# Patient Record
Sex: Female | Born: 1955 | Race: White | Hispanic: No | Marital: Single | Smoking: Never smoker
Health system: Southern US, Community
[De-identification: ages and names within clinical notes are randomized; demographics above are authoritative.]

## PROBLEM LIST (undated history)

## (undated) DIAGNOSIS — I10 Essential (primary) hypertension: Secondary | ICD-10-CM

## (undated) DIAGNOSIS — J45909 Unspecified asthma, uncomplicated: Secondary | ICD-10-CM

## (undated) DIAGNOSIS — T7840XA Allergy, unspecified, initial encounter: Secondary | ICD-10-CM

## (undated) DIAGNOSIS — I639 Cerebral infarction, unspecified: Secondary | ICD-10-CM

## (undated) DIAGNOSIS — F419 Anxiety disorder, unspecified: Secondary | ICD-10-CM

## (undated) DIAGNOSIS — N189 Chronic kidney disease, unspecified: Secondary | ICD-10-CM

## (undated) HISTORY — DX: Unspecified asthma, uncomplicated: J45.909

## (undated) HISTORY — DX: Chronic kidney disease, unspecified: N18.9

## (undated) HISTORY — DX: Cerebral infarction, unspecified: I63.9

## (undated) HISTORY — DX: Essential (primary) hypertension: I10

## (undated) HISTORY — DX: Anxiety disorder, unspecified: F41.9

## (undated) HISTORY — DX: Allergy, unspecified, initial encounter: T78.40XA

---

## 2001-10-13 ENCOUNTER — Other Ambulatory Visit: Admission: RE | Admit: 2001-10-13 | Discharge: 2001-10-13 | Payer: Self-pay | Admitting: Obstetrics and Gynecology

## 2004-12-01 ENCOUNTER — Ambulatory Visit: Payer: Self-pay | Admitting: Endocrinology

## 2004-12-08 ENCOUNTER — Ambulatory Visit: Payer: Self-pay | Admitting: Endocrinology

## 2005-03-27 ENCOUNTER — Inpatient Hospital Stay: Payer: Self-pay | Admitting: Endocrinology

## 2005-04-26 ENCOUNTER — Ambulatory Visit: Payer: Self-pay | Admitting: General Surgery

## 2006-11-18 ENCOUNTER — Emergency Department: Payer: Self-pay | Admitting: Emergency Medicine

## 2006-12-28 IMAGING — CT CT OF THE RIGHT TIBIA FIBULA WITHOUT CONTRAST
1 of 2 series · 9 of 14 positions shown, 12 images · non-contrast
Comparison: none

REASON FOR EXAM: Gangrene on plain films; assess proximal extent
COMMENTS:

[Series 4: inspace · axial · 0.47mm/px · z∈[-1184,-627]mm · 9 of 996 slices shown, 12 images]
[im 100/996  soft-tissue]
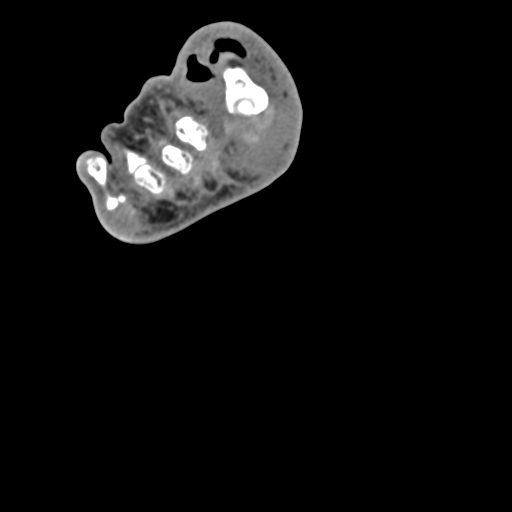
[im 100/996  bone]
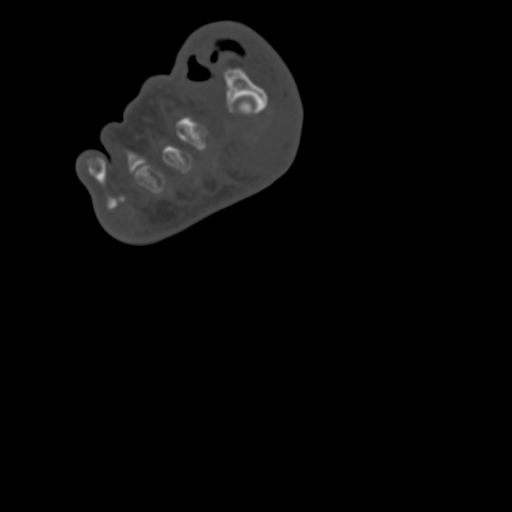
[im 200/996  bone]
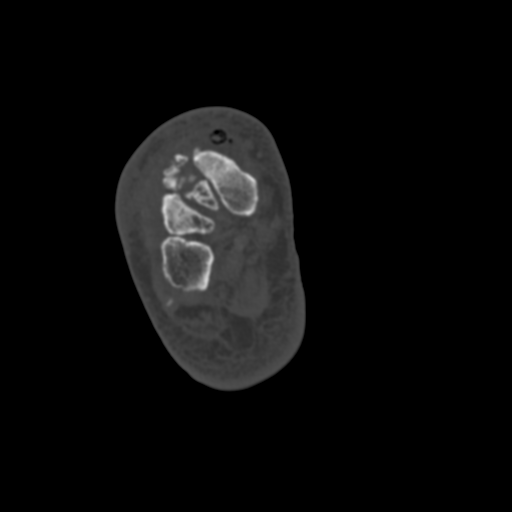
[im 299/996  bone]
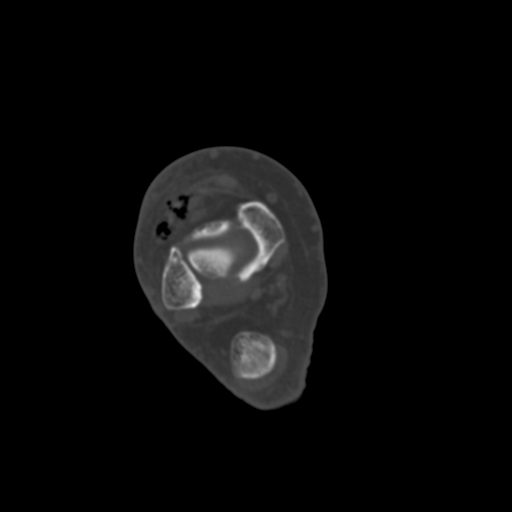
[im 399/996  bone]
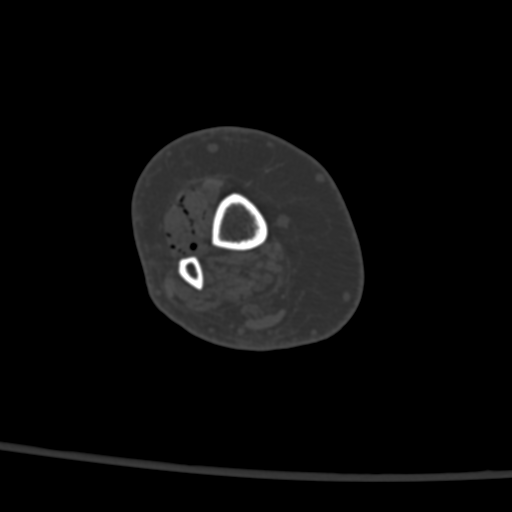
[im 498/996  soft-tissue]
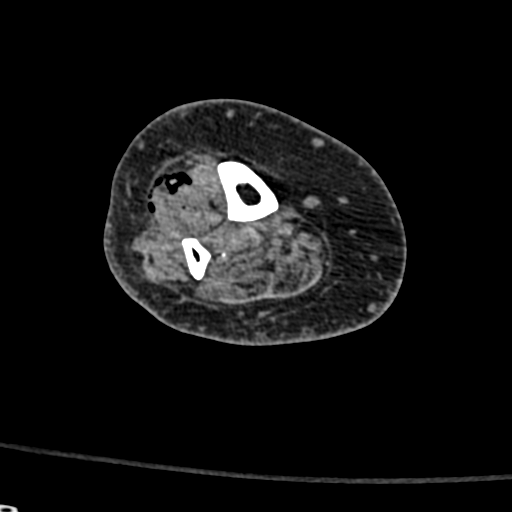
[im 498/996  bone]
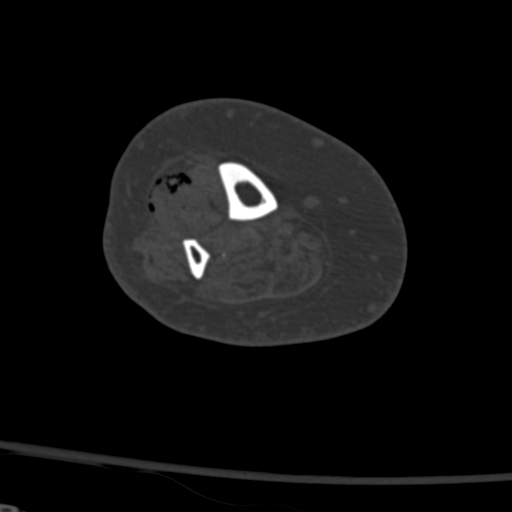
[im 598/996  bone]
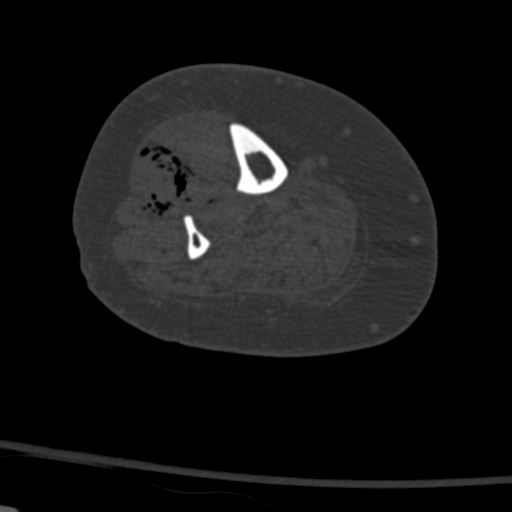
[im 697/996  bone]
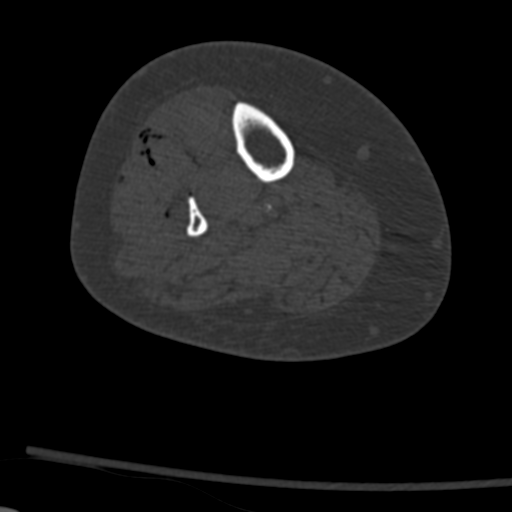
[im 797/996  bone]
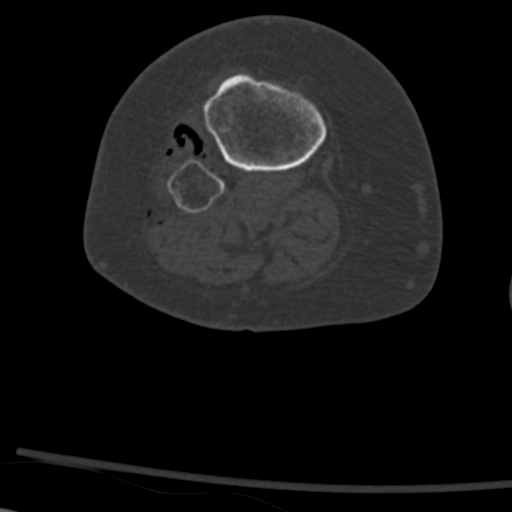
[im 896/996  soft-tissue]
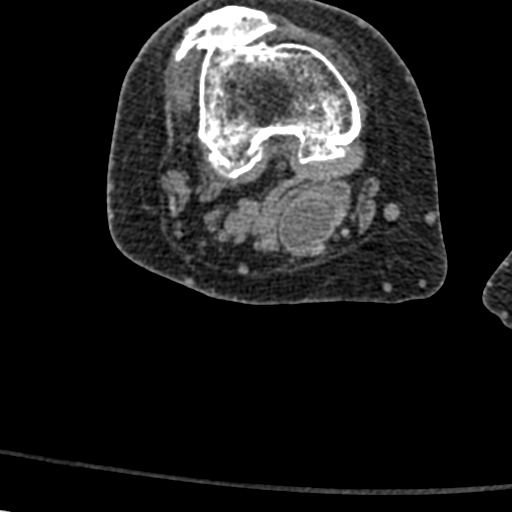
[im 896/996  bone]
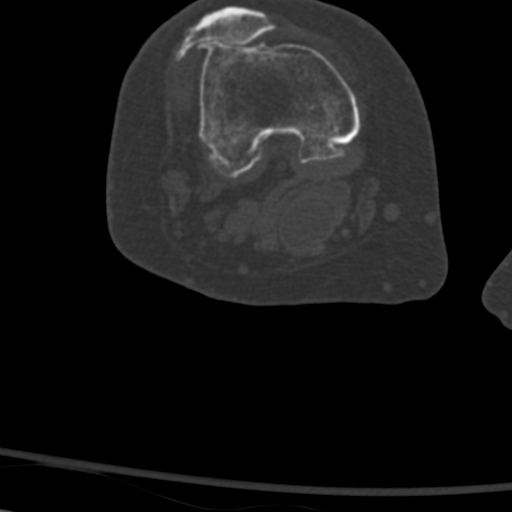

[9 of 14 positions shown; findings below may reference images not displayed]

PROCEDURE:     CT  - CT TIBIA / LOWER RIGHT WO  - March 27, 2005  [DATE]

RESULT:          There is rather extensive soft tissue gas extending from
the great toe in areas of the dorsum of the foot and over the lower
extremity to above the knee.  This is primarily following the tract of the
extensor hallucis longus muscle and tendon.  There may be some involvement
of the extensor digitorum longus muscle near the knee.  There is a small
amount of gas seen posteriorly above the level of the popliteal fossa medial
to the biceps femoris muscle and adjacent to the tibial nerve.  Air is also
seen about the dorsal aspect of the great toe distal to the sesamoids.
There is evidence of cellulitis over the area of the foot.
IMPRESSION: Please see above.

## 2009-04-09 ENCOUNTER — Ambulatory Visit: Payer: Self-pay | Admitting: Internal Medicine

## 2009-04-20 ENCOUNTER — Inpatient Hospital Stay: Payer: Self-pay | Admitting: Internal Medicine

## 2009-04-28 ENCOUNTER — Ambulatory Visit: Payer: Self-pay | Admitting: Internal Medicine

## 2009-05-10 ENCOUNTER — Ambulatory Visit: Payer: Self-pay | Admitting: Internal Medicine

## 2009-06-10 ENCOUNTER — Ambulatory Visit: Payer: Self-pay | Admitting: Internal Medicine

## 2009-07-08 ENCOUNTER — Ambulatory Visit: Payer: Self-pay | Admitting: Internal Medicine

## 2009-08-08 ENCOUNTER — Ambulatory Visit: Payer: Self-pay | Admitting: Internal Medicine

## 2009-09-07 ENCOUNTER — Ambulatory Visit: Payer: Self-pay | Admitting: Internal Medicine

## 2009-10-08 ENCOUNTER — Ambulatory Visit: Payer: Self-pay | Admitting: Internal Medicine

## 2009-10-09 ENCOUNTER — Inpatient Hospital Stay: Payer: Self-pay | Admitting: Internal Medicine

## 2009-11-07 ENCOUNTER — Ambulatory Visit: Payer: Self-pay | Admitting: Internal Medicine

## 2009-11-11 ENCOUNTER — Ambulatory Visit: Payer: Self-pay | Admitting: Internal Medicine

## 2009-12-03 ENCOUNTER — Inpatient Hospital Stay: Payer: Self-pay | Admitting: Internal Medicine

## 2009-12-08 ENCOUNTER — Ambulatory Visit: Payer: Self-pay | Admitting: Internal Medicine

## 2009-12-30 ENCOUNTER — Ambulatory Visit: Payer: Self-pay | Admitting: Vascular Surgery

## 2010-01-08 ENCOUNTER — Ambulatory Visit: Payer: Self-pay | Admitting: Vascular Surgery

## 2010-01-27 ENCOUNTER — Ambulatory Visit: Payer: Self-pay | Admitting: Internal Medicine

## 2010-02-07 ENCOUNTER — Ambulatory Visit: Payer: Self-pay | Admitting: Internal Medicine

## 2010-03-09 ENCOUNTER — Ambulatory Visit: Payer: Self-pay | Admitting: Internal Medicine

## 2010-04-20 ENCOUNTER — Inpatient Hospital Stay: Payer: Self-pay | Admitting: Internal Medicine

## 2010-05-10 DIAGNOSIS — I639 Cerebral infarction, unspecified: Secondary | ICD-10-CM

## 2010-05-10 HISTORY — DX: Cerebral infarction, unspecified: I63.9

## 2010-05-12 ENCOUNTER — Inpatient Hospital Stay: Payer: Self-pay | Admitting: Internal Medicine

## 2010-06-11 ENCOUNTER — Ambulatory Visit: Payer: Self-pay | Admitting: Vascular Surgery

## 2010-06-12 ENCOUNTER — Ambulatory Visit: Payer: Self-pay | Admitting: Vascular Surgery

## 2010-06-17 ENCOUNTER — Ambulatory Visit: Payer: Self-pay | Admitting: Vascular Surgery

## 2010-06-25 ENCOUNTER — Inpatient Hospital Stay: Payer: Self-pay | Admitting: Internal Medicine

## 2010-07-09 ENCOUNTER — Inpatient Hospital Stay: Payer: Self-pay | Admitting: Internal Medicine

## 2010-07-27 ENCOUNTER — Inpatient Hospital Stay: Payer: Self-pay | Admitting: Internal Medicine

## 2010-08-12 ENCOUNTER — Ambulatory Visit: Payer: Self-pay | Admitting: Vascular Surgery

## 2010-09-23 ENCOUNTER — Ambulatory Visit: Payer: Self-pay | Admitting: Vascular Surgery

## 2010-11-06 ENCOUNTER — Ambulatory Visit: Payer: Self-pay | Admitting: Vascular Surgery

## 2010-12-20 ENCOUNTER — Inpatient Hospital Stay: Payer: Self-pay | Admitting: Internal Medicine

## 2011-01-01 ENCOUNTER — Ambulatory Visit: Payer: Self-pay | Admitting: Physician Assistant

## 2011-01-25 ENCOUNTER — Observation Stay: Payer: Self-pay | Admitting: Internal Medicine

## 2011-03-24 ENCOUNTER — Ambulatory Visit: Payer: Self-pay | Admitting: Cardiovascular Disease

## 2012-09-21 IMAGING — CR DG HUMERUS 2V *L*
1 series · 2 of 2 positions shown · non-contrast
Comparison: none

REASON FOR EXAM: pain following trauma
COMMENTS:

PROCEDURE:     DXR - DXR HUMERUS LEFT  - December 20, 2010 [DATE]
RESULT:
A humeral neck fracture is appreciated. Distal fracture fragment
demonstrates lateral angulation and medial displacement.

[Series 1: view not recorded · 0.17mm/px · 2 of 2 slices shown]
[im 1/2]
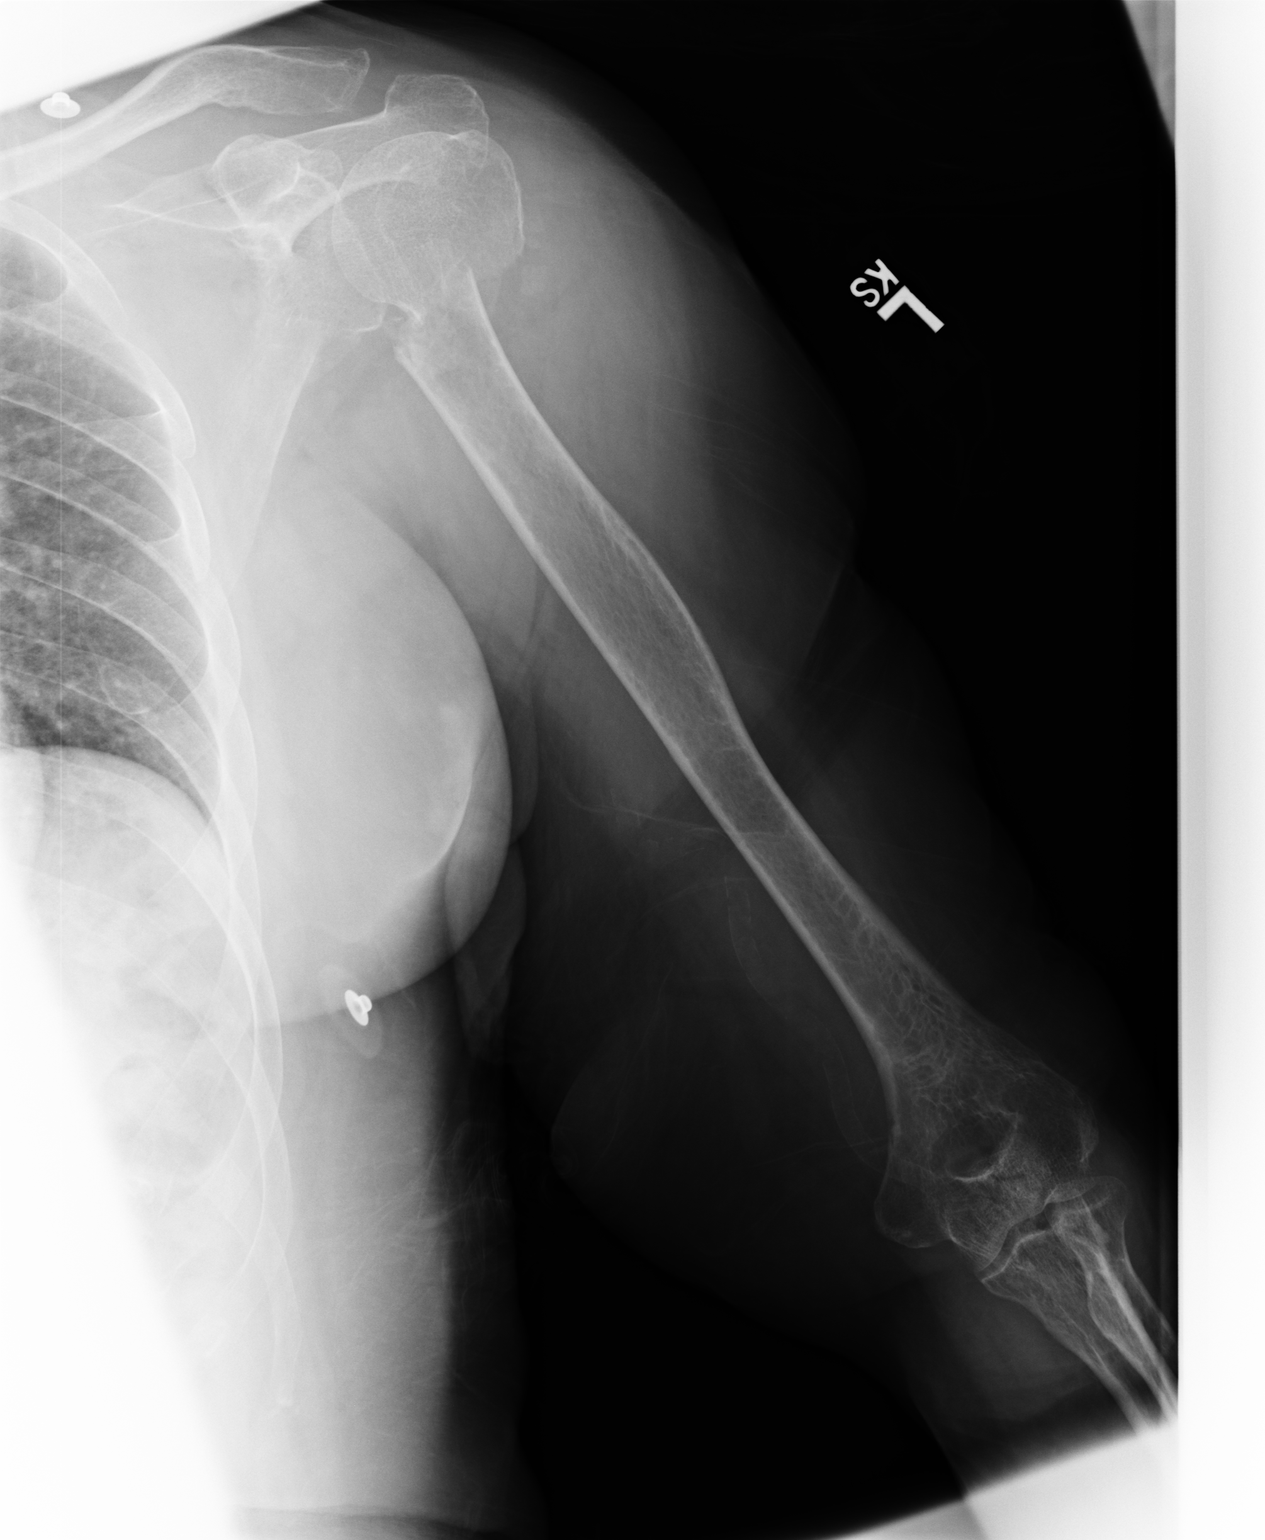
[im 2/2]
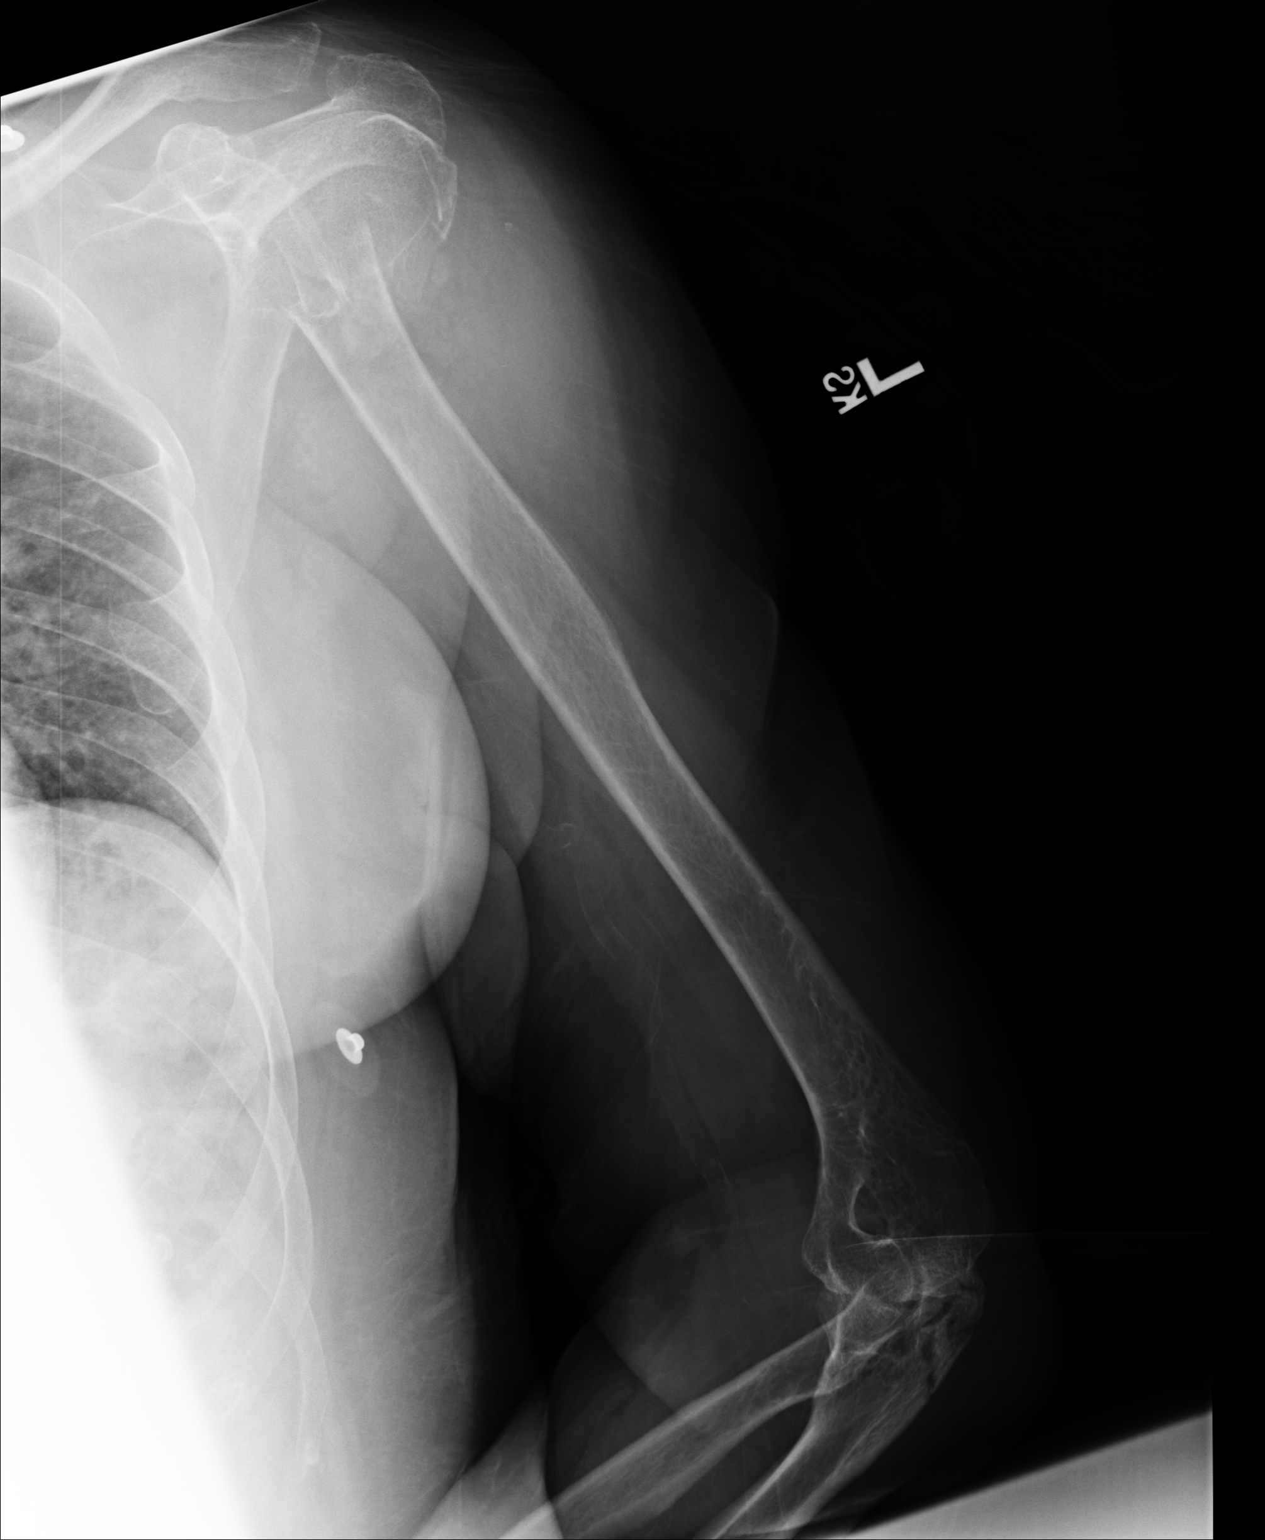

[2 of 2 positions shown; findings below may reference images not displayed]

IMPRESSION: Humeral neck fracture as described above.

## 2013-09-09 ENCOUNTER — Emergency Department: Payer: Self-pay | Admitting: Emergency Medicine

## 2014-11-26 ENCOUNTER — Encounter: Payer: Self-pay | Admitting: Physical Therapy

## 2014-11-26 ENCOUNTER — Ambulatory Visit: Payer: Medicare Other | Attending: Internal Medicine | Admitting: Physical Therapy

## 2014-11-26 DIAGNOSIS — R262 Difficulty in walking, not elsewhere classified: Secondary | ICD-10-CM | POA: Diagnosis not present

## 2014-11-26 DIAGNOSIS — R531 Weakness: Secondary | ICD-10-CM | POA: Insufficient documentation

## 2014-11-26 DIAGNOSIS — R269 Unspecified abnormalities of gait and mobility: Secondary | ICD-10-CM | POA: Insufficient documentation

## 2014-11-26 NOTE — Patient Instructions (Signed)
Seated Alternating Leg Raise (Marching)   Sit on ball. Raise bent knee and return. Repeat with other leg. Do _10__ sets of _2__ repetitions.  Copyright  VHI. All rights reserved.  KNEE: Extension, Long Arc Quads - Sitting   Raise leg until knee is straight. __10 _ reps per set, __2_ sets per day, __5 days per week  Copyright  VHI. All rights reserved.   SIT TO STAND: No Device   Sit with feet shoulder-width apart, on floor.(Make sure that you are in a chair that won't move like a chair against a wall or couch etc) Lean chest forward, raise hips up from surface. Straighten hips and knees. Weight bear equally on left and right sides. 10___ reps per set, _2__ sets per day, _5__ days per week Place left leg closer to sitting surface.

## 2014-11-26 NOTE — Therapy (Addendum)
[facility] [address] [place], [place], [address] Phone: [phone]   Fax:  [phone]  Physical Therapy Evaluation  Patient Details  Name: Carrie DallasWanda L Henry MRN: [id number] Date of Birth: 08/19/1955 Referring Provider:  Leotis ShamesSingh, Jasmine, MD  Encounter Date: 11/26/2014      PT End of Session - 11/26/14 1546    Visit Number 1   Number of Visits 21   Date for PT Re-Evaluation 02/04/15   Authorization Type gcode 1    Authorization Time Period 10    PT Start Time 1301   PT Stop Time 1403   PT Time Calculation (min) 62 min   Equipment Utilized During Treatment Gait belt   Activity Tolerance Patient tolerated treatment well   Behavior During Therapy [facility] for tasks assessed/performed      Past Medical History  Diagnosis Date  . Stroke 2012   . Hypertension   . Anxiety   . Chronic kidney disease   . Allergy   . Asthma     History reviewed. No pertinent past surgical history.  There were no vitals filed for this visit.  Visit Diagnosis:  Abnormality of gait - Plan: PT plan of care cert/re-cert  Weakness - Plan: PT plan of care cert/re-cert  Difficulty walking - Plan: PT plan of care cert/re-cert      Subjective Assessment - 11/26/14 1312    Subjective Patient is a pleasant 59 year old female. Reports to PT with a referral for deconditioning. She states that she has weakness in upper and lower extremities (LE feels more impaired) s/p CVA in 2012, weakness has been problematic for her since the completion of PT in 2012. Patient also on hemodialysis due to end stage renal failure with port in distal L UE. Patient currently receives dialysis for 3 days a week, Monday Wednesday and Friday. She states that she has recently been placed on a kidney transplant list, and could be notified at any point that one has become available.     Pertinent History CVA in 2012. Periteneal dialyiss in 2011.  Hemodalysis started in 2012,  Patient states that she also had a an infection in R LE, almost requiring amputation.  Also has history of A-fib, CAD, and seizure disorder.    Limitations Standing;Walking;House hold activities   How long can you stand comfortably? Patient reports that she feels nervous with standing without AD, but can stand about as long as she needs with AD    How long can you walk comfortably? for about one hours, provided that she has AD.    Patient Stated Goals sit<>stand with out assistance, decrease dependance on ADs   Currently in Pain? No/denies            [facility] PT Assessment - 11/27/14 0001    Assessment   Medical Diagnosis Deconditioning.    Onset Date/Surgical Date 11/26/14   Hand Dominance Right   Next MD Visit Will see Dr Thedore MinsSingh at least once a week at dialysis.    Prior Therapy PT for CVA in 2012, Reports that PT lasted for 3 months at Skilled nursing facility and some home health for weakness. No recent PT for this condition.    Precautions   Precautions Fall   Restrictions   Weight Bearing Restrictions No   Home Environment   Living Environment Private residence   Living Arrangements Alone   Available Help at Discharge Family;Neighbor   Type of Home Apartment   Home  Access Ramped entrance   Home Layout One level   Home Equipment Cane - quad;Walker - 4 wheels;Wheelchair - manual;Grab bars - tub/shower;Shower seat   Prior Function   Level of Independence Independent with basic ADLs   Vocation Retired;On disability   Leisure Information systems manager arrangements.    Cognition   Overall Cognitive Status Within Functional Limits for tasks assessed   Observation/Other Assessments   Observations Scar on lateral  R LE, discoloration on Bilateral LE distal to knee. Fistula in distal L UE.    Activities of Balance Confidence Scale (ABC Scale)  53% (increased risk for falls)   Sensation   Additional Comments no numbness/tingling. Edema noted in Bilateral LE    Coordination   Gross Motor  Movements are Fluid and Coordinated Yes   Fine Motor Movements are Fluid and Coordinated Yes   Strength   Overall Strength Comments L UE 4/5 in shoulder, 4+/5 in elbow . R UE 4-/5 in shoulder, 4/4 in elbow . Hip flexion 4-/5 bilaterally L knee extension/flexion 4+/5. R knee flexion/extension 4/5 . hip abduction/adduction 5/5    Transfers   Comments Patient required definite use of hands for transfer from [facility] to chair, as well as RW for stability.    Ambulation/Gait   Gait Comments Decreased step length and foot clearance bilaterally. increased shoulder shrug on R shoulder in stance and with gait. Patient also ambulates with flexed trunk posture.    6 Minute walk- Post Test   6 Minute Walk Post Test yes   BP (mmHg) 133/85 mmHg   HR (bpm) 54   02 Sat (%RA) 99 %   Modified Borg Scale for Dyspnea 2- Mild shortness of breath   6 minute walk test results    Aerobic Endurance Distance Walked 400   Endurance additional comments <1000 ft indicates decreased community ambulation.    Standardized Balance Assessment   Five times sit to stand comments  39 seconds (>15 seconds indicates increased fall risk)   10 Meter Walk 0.4 m/s ( <1.0 indicates increased fall risk)    Timed Up and Go Test   TUG Normal TUG   Normal TUG (seconds) 37.4   TUG Comments (>12 seconds indicates fall risk .          Treatment:   Educated patient on HEP.  Sit to stand x10  Seated marches x10 each LE  Seated knee extension x10 each LE.   Moderate Verbal cues for increased ROM, decreased speed of movement, and proper use of UE with sit to stand.                   PT Education - 11/26/14 1705    Education provided Yes   Education Details plan of care. HEP - see patient instructions.    Person(s) Educated Patient   Methods Explanation;Demonstration;Verbal cues   Comprehension Verbalized understanding;Returned demonstration;Verbal cues required             PT Long Term Goals - 11/26/14 1701     PT LONG TERM GOAL #1   Title Patient will be independent with HEP in order to increase strength/balance and allow greater independence with iADLs by 02/04/2015   Time 10   Period Weeks   Status New   PT LONG TERM GOAL #2   Title Patient will increase 6 minute walk test to >1000 ft in order to increase access to community with greater independence by 02/04/2015   Time 10   Period Weeks  Status New   PT LONG TERM GOAL #3   Title Patient will decrease 5xSTS to <15 seconds in order to increase LE power and reduce fall risk by 02/04/2015   Time 10   Period Weeks   Status New   PT LONG TERM GOAL #4   Title Patient will increase LE strength to at least 4+/5 throughout in order to allow increased independence with household chores by 02/04/2015   Time 10   Period Weeks   Status New   PT LONG TERM GOAL #5   Title Patient will improve Gait speed to >0.32m/s to be considered a community ambulator and decrease risk for falls by 02/04/2015   Time 10   Period Weeks   Status New               Plan - 11/26/14 1549    Clinical Impression Statement Patient is a pleasant 59 year old female that reports to PT with deconditioning. Patient presents with decreased strength in Bilateral UE and LE, R LE and R UE are more impaired than L. ROM is grossly within functional limits in bilateral UE and LE. Patient demonstrates decreased LE power, balance, and endurance as evidenced by standardized outcome measures including the 5xSTS (39 seconds; >15 indicates increased fall risk), (0.4 m/s, Indicates patient is at the household ambulation level and increased fall risk), TUG(37 seconds; >12 seconds indicates increased fall risk), and  6 minute walk test (400 ft; <1000 ft indicates decreased community ambulation.) Patient also scored a 51% on the ABC-s(indicating increased risk for falls). Due to deficits found through PT eval, this patient would benefit from PT in order to increase strength, improve gait,  and enhance balance to increase ease of access to the community and increased independence with iADLs.   Pt will benefit from skilled therapeutic intervention in order to improve on the following deficits Abnormal gait;Cardiopulmonary status limiting activity;Decreased activity tolerance;Decreased balance;Decreased endurance;Decreased mobility;Decreased safety awareness;Decreased strength;Difficulty walking;Increased edema;Impaired perceived functional ability;Improper body mechanics;Postural dysfunction   Rehab Potential Fair   Clinical Impairments Affecting Rehab Potential Negative: Low level baseline, Comorbitiies including CAD, A-fib, ESRD, history of CVA, low family support. Positive: hopeful to improve.    PT Frequency 2x / week   PT Duration Other (comment)  10 weeks.    PT Treatment/Interventions ADLs/Self Care Home Management;Cryotherapy;Moist Heat;Gait training;Stair training;Functional mobility training;Therapeutic activities;Therapeutic exercise;Balance training;Neuromuscular re-education;Patient/family education;Wheelchair mobility training;Energy conservation   PT Next Visit Plan Strengthening exercises. Berg balance scale.    PT Home Exercise Plan see patient instructions.    Consulted and Agree with Plan of Care Patient          G-Codes - Dec 04, 2014 0942    Functional Assessment Tool Used 6 min walk, 10 meter, TUG, 5 times sit<>stand   Functional Limitation Mobility: Walking and moving around   Mobility: Walking and Moving Around Current Status [phone]) At least 60 percent but less than 80 percent impaired, limited or restricted   Mobility: Walking and Moving Around Goal Status [phone]) At least 20 percent but less than 40 percent impaired, limited or restricted       Problem List There are no active problems to display for this patient. Thank you for this referral. Grier Rocher, SPT This entire session was performed under direct supervision and direction of a licensed  therapist . I have personally read, edited and approve of the note as written.   Hopkins,Margaret, PT, DPT 04-Dec-2014, 9:48 AM  [facility]  MAIN [facility] SERVICES [address] [place], [place], [address] Phone: [phone]   Fax:  [phone]

## 2014-12-03 ENCOUNTER — Ambulatory Visit: Payer: Medicare Other | Admitting: Physical Therapy

## 2014-12-05 ENCOUNTER — Ambulatory Visit: Payer: Medicare Other | Admitting: Physical Therapy

## 2014-12-06 ENCOUNTER — Encounter: Payer: Medicare Other | Admitting: Physical Therapy

## 2014-12-09 ENCOUNTER — Encounter: Payer: Medicare Other | Admitting: Physical Therapy

## 2014-12-10 ENCOUNTER — Ambulatory Visit: Payer: Medicare Other | Attending: Internal Medicine | Admitting: Physical Therapy

## 2014-12-10 ENCOUNTER — Encounter: Payer: Self-pay | Admitting: Physical Therapy

## 2014-12-10 DIAGNOSIS — R262 Difficulty in walking, not elsewhere classified: Secondary | ICD-10-CM | POA: Insufficient documentation

## 2014-12-10 DIAGNOSIS — R269 Unspecified abnormalities of gait and mobility: Secondary | ICD-10-CM | POA: Diagnosis not present

## 2014-12-10 DIAGNOSIS — R531 Weakness: Secondary | ICD-10-CM | POA: Insufficient documentation

## 2014-12-10 NOTE — Therapy (Signed)
[facility] MAIN [facility] SERVICES [address] [place], [place], [address] Phone: [phone]   Fax:  [phone]  Physical Therapy Treatment  Patient Details  Name: Carrie Henry MRN: [id number] Date of Birth: 13-Oct-1955 Referring Provider:  Hilda Blades, MD  Encounter Date: 12/10/2014      PT End of Session - 12/10/14 1311    Visit Number 2   Number of Visits 21   Date for PT Re-Evaluation 02/04/15   Authorization Type gcode 2   Authorization Time Period 10    PT Start Time 1300   PT Stop Time 1345   PT Time Calculation (min) 45 min   Equipment Utilized During Treatment Gait belt   Activity Tolerance Patient tolerated treatment well   Behavior During Therapy [facility] for tasks assessed/performed      Past Medical History  Diagnosis Date  . Stroke 2012   . Hypertension   . Anxiety   . Chronic kidney disease   . Allergy   . Asthma     History reviewed. No pertinent past surgical history.  There were no vitals filed for this visit.  Visit Diagnosis:  Abnormality of gait  Weakness  Difficulty walking      Subjective Assessment - 12/10/14 1309    Subjective Patient reports that she is doing well on this day. has not been seen at PT since 7/19 due to transportation difficulties. States that she has been doing her home exercises at least 2-3 times a day.    Pertinent History CVA in 2012. Periteneal dialyiss in 2011.  Hemodalysis started in 2012, Patient states that she also had a an infection in R LE, almost requiring amputation.  Also has history of A-fib, CAD, and seizure disorder.    Limitations Standing;Walking;House hold activities   How long can you stand comfortably? Patient reports that she feels nervous with standing without AD, but can stand about as long as she needs with AD    How long can you walk comfortably? for about one hours, provided that she has AD.    Patient Stated Goals sit<>stand with out assistance, decrease  dependance on ADs   Currently in Pain? No/denies            [facility] PT Assessment - 12/10/14 1329    Standardized Balance Assessment   Standardized Balance Assessment Berg Balance Test   Berg Balance Test   Sit to Stand Able to stand  independently using hands   Standing Unsupported Able to stand safely 2 minutes   Sitting with Back Unsupported but Feet Supported on Floor or Stool Able to sit safely and securely 2 minutes   Stand to Sit Uses backs of legs against chair to control descent   Transfers Able to transfer safely, definite need of hands   Standing Unsupported with Eyes Closed Able to stand 10 seconds safely   Standing Ubsupported with Feet Together Needs help to attain position but able to stand for 30 seconds with feet together   From Standing, Reach Forward with Outstretched Arm Can reach forward >5 cm safely (2")   From Standing Position, Pick up Object from Floor Able to pick up shoe, needs supervision   From Standing Position, Turn to Look Behind Over each Shoulder Turn sideways only but maintains balance   Turn 360 Degrees Able to turn 360 degrees safely but slowly   Standing Unsupported, Alternately Place Feet on Step/Stool Needs assistance to keep from falling or unable to try  Standing Unsupported, One Foot in Front Needs help to step but can hold 15 seconds   Standing on One Leg Able to lift leg independently and hold equal to or more than 3 seconds   Total Score 33        Treatment:  Nustep: level 2,  (unbilled)    Seated therapeutic exercise  Hip flexion 2x10 AROM bilaterally  Knee extension 2x10 AROM bilaterally  Sit to stand x8 with bilateral UE use ( significant fatigue noted)  Ankle PF 2x15 with red tband , bilaterally  Hip abduction 2x10 with red tband , bilaterally  Verbal and tactile instruction needed to improve exercise form increased hold time at end range for all exercises, increased control with eccentric movement, and increase  speed of movement with concentric motions.   HEP advancement/education Standing hip abduction x 10 , bilaterally  Standing hip flexion x10, bilaterally  Standing hip extension x10 , bilaterally   CGA, Verbal and tactile instruction for increased ROM, decreased speed of movement, and decreased compensation from trunk with LE movements.   patient performed Berg Balance Scale: 33/56  Significant difficulty with dynamic balance tasks and change of position.               PT Education - 12/10/14 1832    Education provided Yes   Education Details HEP advancement see patient instructions    Person(s) Educated Patient   Methods Explanation;Demonstration;Tactile cues;Verbal cues   Comprehension Verbalized understanding;Returned demonstration;Verbal cues required             PT Long Term Goals - 11/26/14 1701    PT LONG TERM GOAL #1   Title Patient will be independent with HEP in order to increase strength/balance and allow greater independence with iADLs by 02/04/2015   Time 10   Period Weeks   Status New   PT LONG TERM GOAL #2   Title Patient will increase 6 minute walk test to >1000 ft in order to increase access to community with greater independence by 02/04/2015   Time 10   Period Weeks   Status New   PT LONG TERM GOAL #3   Title Patient will decrease 5xSTS to <15 seconds in order to increase LE power and reduce fall risk by 02/04/2015   Time 10   Period Weeks   Status New   PT LONG TERM GOAL #4   Title Patient will increase LE strength to at least 4+/5 throughout in order to allow increased independence with household chores by 02/04/2015   Time 10   Period Weeks   Status New   PT LONG TERM GOAL #5   Title Patient will improve Gait speed to >0.63m/s to be considered a community ambulator and decrease risk for falls by 02/04/2015   Time 10   Period Weeks   Status New               Plan - 12/10/14 1833    Clinical Impression Statement Patient instructed in  LE strengthening exercises on this day, CGA for standing therex as well as verbal and tactile instruction with all therex for proper exercise form including increased hold time at end range for all exercises, increased control with eccentric movement, and increase speed of movement with concentric motions. Patient demonstrates significant fatigue with sit to stand exercises on this day. Patient did demonstrate the ability to ambulate without an assistive device for up to 5 feet on this day. Patient also completed the Berg balance scale(33/56;  hight fall risk); significant difficulty noted with all dynamic balance activities. Continued skilled PT is recommended to increase LE and UE strength in order to improve function with ADLs and increase quality of life.   Pt will benefit from skilled therapeutic intervention in order to improve on the following deficits Abnormal gait;Cardiopulmonary status limiting activity;Decreased activity tolerance;Decreased balance;Decreased endurance;Decreased mobility;Decreased safety awareness;Decreased strength;Difficulty walking;Increased edema;Impaired perceived functional ability;Improper body mechanics;Postural dysfunction   Rehab Potential Fair   Clinical Impairments Affecting Rehab Potential Negative: Low level baseline, Comorbitiies including CAD, A-fib, ESRD, history of CVA, low family support. Positive: hopeful to improve.    PT Frequency 2x / week   PT Duration Other (comment)  10 weeks.    PT Treatment/Interventions ADLs/Self Care Home Management;Cryotherapy;Moist Heat;Gait training;Stair training;Functional mobility training;Therapeutic activities;Therapeutic exercise;Balance training;Neuromuscular re-education;Patient/family education;Wheelchair mobility training;Energy conservation   PT Next Visit Plan Strengthening exercises. Berg balance scale.    PT Home Exercise Plan see patient instructions.    Consulted and Agree with Plan of Care Patient         Problem List There are no active problems to display for this patient.  Grier Rocher SPT 12/11/2014   2:56 PM  This entire session was performed under direct supervision and direction of a licensed therapist . I have personally read, edited and approve of the note as written.  Hopkins,Margaret 12/11/2014, 2:56 PM  [facility] MAIN [facility] SERVICES [address] [place], [place], [address] Phone: [phone]   Fax:  [phone]

## 2014-12-11 ENCOUNTER — Encounter: Payer: Medicare Other | Admitting: Physical Therapy

## 2014-12-11 NOTE — Patient Instructions (Addendum)
HEP advancement/education Standing hip abduction x 10 , bilaterally  Standing hip flexion x10, bilaterally  Standing hip extension x10 , bilaterally    PT provided hand out for HEP advancement.

## 2014-12-12 ENCOUNTER — Ambulatory Visit: Payer: Medicare Other | Admitting: Physical Therapy

## 2014-12-12 ENCOUNTER — Encounter: Payer: Self-pay | Admitting: Physical Therapy

## 2014-12-12 DIAGNOSIS — R269 Unspecified abnormalities of gait and mobility: Secondary | ICD-10-CM | POA: Diagnosis not present

## 2014-12-12 DIAGNOSIS — R531 Weakness: Secondary | ICD-10-CM

## 2014-12-12 DIAGNOSIS — R262 Difficulty in walking, not elsewhere classified: Secondary | ICD-10-CM

## 2014-12-12 NOTE — Therapy (Signed)
[facility] MAIN REHAB SERVICES [address] [place], [place], [address] Phone: [phone]   Fax:  [phone]  Physical Therapy Treatment  Patient Details  Name: Carrie Henry MRN: [id number] Date of Birth: 01/28/1956 Referring Provider:  Singh, Taran K, MD  Encounter Date: 12/12/2014      PT End of Session - 12/12/14 1124    Visit Number 3   Number of Visits 21   Date for PT Re-Evaluation 02/04/15   Authorization Type gcode [id number]   Authorization Time Period 10    PT Start Time 1015   PT Stop Time 1101   PT Time Calculation (min) 46 min   Equipment Utilized During Treatment Gait belt   Activity Tolerance Patient tolerated treatment well   Behavior During Therapy WFL for tasks assessed/performed      Past Medical History  Diagnosis Date  . Stroke 2012   . Hypertension   . Anxiety   . Chronic kidney disease   . Allergy   . Asthma     History reviewed. No pertinent past surgical history.  There were no vitals filed for this visit.  Visit Diagnosis:  Abnormality of gait  Weakness  Difficulty walking      Subjective Assessment - 12/12/14 1031    Subjective Patient reports that she is doing well on this day. States that has some LE soreness due to increased activity with home exercise program.    Pertinent History CVA in 2012. Periteneal dialyiss in 2011.  Hemodalysis started in 2012, Patient states that she also had a an infection in R LE, almost requiring amputation.  Also has history of A-fib, CAD, and seizure disorder.    Limitations Standing;Walking;House hold activities   How long can you stand comfortably? Patient reports that she feels nervous with standing without AD, but can stand about as long as she needs with AD    How long can you walk comfortably? for about one hours, provided that she has AD.    Patient Stated Goals sit<>stand with out assistance, decrease dependance on ADs   Currently in Pain? No/denies       Treatment:  Nustep: level 2, <MEASUREMYuma Surgery Center LLCW315J<MEASUREMENUnion HospitalW570J<MEASUREMENBirmingham Surgery CenterW2<MEASUREMENSouthern Illinois Orthopedic CenterLLCW774JChi St Lukes <MEASUREMENTallahatchie General Ho<MEASUREMENLawton Indian HospitalW857JSeton Me<MEASUREMENRed Rocks Surgery Centers LLCW21<MEASUREMENCornerstone Speciality Hospital - Medical CenterWKorea(859)JGrea<MEASUREMENHershey Endoscopy Center LLCW(585JG<MEASUREMENSheppard And Enoch <MEASUREMENMemorial Health Center ClinicsW719JVa Medical <MEASUREMENPromise Hospital Of VicksburgW7JCabinet Peak<MEASUREMENUnion Hospital ClintonW310JCitrus Valley Medical Cente<MEASUREMENUcsd Surgical Center Of San Diego LLCW419JGeisinger Encompass Health Rehabilitati<MEASUREMENNovamed Surgery Center Of Denver LLCW(802)JAscension <MEASUREMENE Ronald Salvitti Md Dba [facility] Eye Surgery CenterW671JNorth<MEASUREMENQuitman County HospitalW7JMount Carmel Guild <MEASUREMENMillmanderr Center For Eye Care PcW(5<MEASUREMENHealing Arts Surgery Cen<MEASUREMENPhysicians Alliance Lc Dba Physicians Alliance Surgery CenterW937JSelect Specialty Hospita<MEASUREMENSt. John'S Pleasant Valley Hospita<MEASUREMENThe Eye Surgery Center Of East TennesseeW605JArc Worcest<MEASUREMENProvidence Saint Joseph Medical C<MEASUREMENElite Surg<MEASUREMENGila Regional Medical CenterW279JColumb<MEASUREMENSummit Surgical Center LLCW331JMile Squar<MEASUREMENStamford HospitalW93<MEASUREMENThe Matheny Medical And Educational CenterW610JSaint Lu<MEASUREMENCleveland Clinic HospitalW334JBhc Fairfax785-3Marland Kitchen87Walker [facility] [place](MarylandLow sion 2x10 bilaterally  AROM on R, yellow tband on L  Sit to stand x10 with bilateral UE use (fatigue noted)  Hip abduction 2x10 with red tband , bilaterally  Knee flexion 2x10 with red tband bilaterally Verbal and tactile instruction needed to improve exercise form increased hold time at end range for all exercises, increased control with eccentric movement, and increase speed of movement with concentric motions.   Standing hip abduction with yellow tband  x 10 , bilaterally  Standing hip flexion yellow tband x10, bilaterally  Standing hip extension yellow tband x10 , bilaterally  CGA, Verbal and tactile instruction for increased ROM, decreased speed of movement, and decreased compensation from trunk with LE movements. Patient instructed to increase HEP to include resistance from yellow tband with standing exercises.   Supine:  Bridges x8  SLR with ER x8 bilaterally, CGA-MinA provided by PT Verbal instruction to increased core activation and increase eccentric control.                                  PT Long Term Goals - 11/26/14 1701    PT LONG TERM GOAL #  1   Title Patient will be independent with HEP in order to increase strength/balance and allow greater independence with iADLs by 02/04/2015   Time 10   Period Weeks   Status New   PT LONG TERM GOAL #2   Title Patient will increase 6 minute walk test to >1000 ft in order to increase access to community with greater independence by 02/04/2015   Time 10   Period Weeks   Status New   PT LONG TERM GOAL #3   Title Patient will decrease 5xSTS to <15 seconds in order to increase LE power and reduce fall risk by 02/04/2015   Time 10   Period Weeks   Status New   PT LONG TERM GOAL #4   Title Patient will increase LE strength to at least 4+/5 throughout in order  to allow increased independence with household chores by 02/04/2015   Time 10   Period Weeks   Status New   PT LONG TERM GOAL #5   Title Patient will improve Gait speed to >0.10m/s to be considered a community ambulator and decrease risk for falls by 02/04/2015   Time 10   Period Weeks   Status New               Plan - 12/12/14 1125    Clinical Impression Statement Patient instructed in LE strengthening on this day. CGA provided with standing therex, moderate verbal and tactile instruction to decrease trunk compensation and improve eccentric control provided for standing and sitting therex. Moderate verbal instruction provided to increase proper weight shift with sit to stand and to decrease LE anchoring on mat table. Patient demonstrated moderate response to verbal and tactile instruction on this day. Patient reports slight increased muscle soreness following PT on this day. Continued skilled PT is recommended to improve LE strength, endurance, and improve gait to allow greater independence with ADLs.    Pt will benefit from skilled therapeutic intervention in order to improve on the following deficits Abnormal gait;Cardiopulmonary status limiting activity;Decreased activity tolerance;Decreased balance;Decreased endurance;Decreased mobility;Decreased safety awareness;Decreased strength;Difficulty walking;Increased edema;Impaired perceived functional ability;Improper body mechanics;Postural dysfunction   Rehab Potential Fair   Clinical Impairments Affecting Rehab Potential Negative: Low level baseline, Comorbitiies including CAD, A-fib, ESRD, history of CVA, low family support. Positive: hopeful to improve.    PT Frequency 2x / week   PT Duration Other (comment)  10 weeks.    PT Treatment/Interventions ADLs/Self Care Home Management;Cryotherapy;Moist Heat;Gait training;Stair training;Functional mobility training;Therapeutic activities;Therapeutic exercise;Balance training;Neuromuscular  re-education;Patient/family education;Wheelchair mobility training;Energy conservation   PT Next Visit Plan Strengthening exercises.    PT Home Exercise Plan continue as given,    Consulted and Agree with Plan of Care Patient        Problem List There are no active problems to display for this patient.  Grier Rocher SPT 12/12/2014   5:16 PM  This entire session was performed under direct supervision and direction of a licensed therapist. I have personally read, edited and approve of the note as written.   Hopkins,Margaret 12/12/2014, 5:16 PM  [facility] MAIN [facility] SERVICES [address] [place], [place], [address] Phone: [phone]   Fax:  [phone]

## 2014-12-17 ENCOUNTER — Ambulatory Visit: Payer: Medicare Other | Admitting: Physical Therapy

## 2014-12-17 ENCOUNTER — Encounter: Payer: Self-pay | Admitting: Physical Therapy

## 2014-12-17 DIAGNOSIS — R262 Difficulty in walking, not elsewhere classified: Secondary | ICD-10-CM

## 2014-12-17 DIAGNOSIS — R269 Unspecified abnormalities of gait and mobility: Secondary | ICD-10-CM | POA: Diagnosis not present

## 2014-12-17 DIAGNOSIS — R531 Weakness: Secondary | ICD-10-CM

## 2014-12-17 NOTE — Therapy (Signed)
[place] [facility] MAIN [facility] SERVICES [address] [place], [place], [address] Phone: [phone]   Fax:  [phone]  Physical Therapy Treatment  Patient Details  Name: Carrie Henry MRN: [id number] Date of Birth: 11-Jun-1955 Referring Provider:  Hilda Blades, MD  Encounter Date: 12/17/2014      PT End of Session - 12/17/14 1155    Visit Number 4   Number of Visits 21   Date for PT Re-Evaluation 02/04/15   Authorization Type gcode 4   Authorization Time Period 10    PT Start Time 1018   PT Stop Time 1100   PT Time Calculation (min) 42 min   Equipment Utilized During Treatment Gait belt   Activity Tolerance Patient tolerated treatment well   Behavior During Therapy [facility] for tasks assessed/performed      Past Medical History  Diagnosis Date  . Stroke 2012   . Hypertension   . Anxiety   . Chronic kidney disease   . Allergy   . Asthma     History reviewed. No pertinent past surgical history.  There were no vitals filed for this visit.  Visit Diagnosis:  Abnormality of gait  Weakness  Difficulty walking      Subjective Assessment - 12/17/14 1026    Subjective Patient states that she is doing well this AM, states that she is a little sore from her home exercises, but otherwise feels pretty good.    Pertinent History CVA in 2012. Periteneal dialyiss in 2011.  Hemodalysis started in 2012, Patient states that she also had a an infection in R LE, almost requiring amputation.  Also has history of A-fib, CAD, and seizure disorder.    Limitations Standing;Walking;House hold activities   How long can you stand comfortably? Patient reports that she feels nervous with standing without AD, but can stand about as long as she needs with AD    How long can you walk comfortably? for about one hours, provided that she has AD.    Patient Stated Goals sit<>stand with out assistance, decrease dependance on ADs   Currently in Pain? No/denies            Treatment:  Nustep: level 2, (unbilled)   Sit to stand x8 with bilateral UE use  Standing marches 2x12.  Side stepping with yellow tband 92ft x6  CGA, verbal instruction to increase ROM, increase eccentric control. Less fatigue noticed with all standing therex.   Supine:  Bridges 2x10  SLR with ER x8 bilaterally, CGA-MinA provided by PT  Verbal instruction to increased core activation and increase eccentric control.  Balance exercises Toe tapping on 5 inch step x8 each LE  In and out of semi tandem stance x12 each LE  Standing on airex pad, trunk rotations with ball pass x8 each direction.   Standing on airex pad normal BOS 2x30 seconds with weight shift.  CGA for all balance exercises. Verbal and tactile instruction for proper weight shifting and decreased UE use.  Patient also instructed in gait training without AD for 10 ft x2, CGA and verbal instruction for weight shift, but improved step length and weight shift noted from previous sessions.   Patient was able to complete all standing therex and balance exercises without any seated rest breaks on this day; this is greatly improved from previous sessions.  PT Education - 12/17/14 1027    Education provided Yes   Education Details Strength and balance exercises   Person(s) Educated Patient   Methods Explanation;Demonstration;Tactile cues;Verbal cues   Comprehension Verbalized understanding;Returned demonstration;Verbal cues required;Tactile cues required             PT Long Term Goals - 11/26/14 1701    PT LONG TERM GOAL #1   Title Patient will be independent with HEP in order to increase strength/balance and allow greater independence with iADLs by 02/04/2015   Time 10   Period Weeks   Status New   PT LONG TERM GOAL #2   Title Patient will increase 6 minute walk test to >1000 ft in order to increase access to community with greater independence  by 02/04/2015   Time 10   Period Weeks   Status New   PT LONG TERM GOAL #3   Title Patient will decrease 5xSTS to <15 seconds in order to increase LE power and reduce fall risk by 02/04/2015   Time 10   Period Weeks   Status New   PT LONG TERM GOAL #4   Title Patient will increase LE strength to at least 4+/5 throughout in order to allow increased independence with household chores by 02/04/2015   Time 10   Period Weeks   Status New   PT LONG TERM GOAL #5   Title Patient will improve Gait speed to >0.88m/s to be considered a community ambulator and decrease risk for falls by 02/04/2015   Time 10   Period Weeks   Status New               Plan - 12/17/14 1156    Clinical Impression Statement Patient instructed in LE strengthening and balance on this day. Patient required verbal and tactile instruction for proper exercise technique including decreased compensation from trunk with standing exercises and proper weight shifting with balance exercises. Patient responded well to instruction and was able to perform multiple balance exercises without UE support and was able to walk up to 10 feet without AD on this day with CGA. Continued skilled PT is recommend to increase LE strength, improve walking, and balance to increase independence with ADLs.   Pt will benefit from skilled therapeutic intervention in order to improve on the following deficits Abnormal gait;Cardiopulmonary status limiting activity;Decreased activity tolerance;Decreased balance;Decreased endurance;Decreased mobility;Decreased safety awareness;Decreased strength;Difficulty walking;Increased edema;Impaired perceived functional ability;Improper body mechanics;Postural dysfunction   Rehab Potential Fair   Clinical Impairments Affecting Rehab Potential Negative: Low level baseline, Comorbitiies including CAD, A-fib, ESRD, history of CVA, low family support. Positive: hopeful to improve.    PT Frequency 2x / week   PT Duration  Other (comment)  10 weeks.    PT Treatment/Interventions ADLs/Self Care Home Management;Cryotherapy;Moist Heat;Gait training;Stair training;Functional mobility training;Therapeutic activities;Therapeutic exercise;Balance training;Neuromuscular re-education;Patient/family education;Wheelchair mobility training;Energy conservation   PT Next Visit Plan Strengthening exercises. balance    PT Home Exercise Plan continue as given,    Consulted and Agree with Plan of Care Patient        Problem List There are no active problems to display for this patient.  Grier Rocher SPT 12/17/2014   2:56 PM  This entire session was performed under direct supervision and direction of a licensed therapist/therapist assistant . I have personally read, edited and approve of the note as written. Albertina Parr, PT, DPT, [phone] 12/17/2014 2:56 PM  [place] [facility] MAIN [facility] SERVICES [address]  [place], [place], [address] Phone: [phone]   Fax:  [phone]

## 2014-12-19 ENCOUNTER — Encounter: Payer: Self-pay | Admitting: Physical Therapy

## 2014-12-19 ENCOUNTER — Ambulatory Visit: Payer: Medicare Other | Admitting: Physical Therapy

## 2014-12-19 DIAGNOSIS — R531 Weakness: Secondary | ICD-10-CM

## 2014-12-19 DIAGNOSIS — R262 Difficulty in walking, not elsewhere classified: Secondary | ICD-10-CM

## 2014-12-19 DIAGNOSIS — R269 Unspecified abnormalities of gait and mobility: Secondary | ICD-10-CM

## 2014-12-19 NOTE — Therapy (Signed)
[place] [facility] MAIN [facility] SERVICES [address] [place], [place], [address] Phone: [phone]   Fax:  [phone]  Physical Therapy Treatment  Patient Details  Name: Carrie Henry MRN: [id number] Date of Birth: May 17, 1955 Referring Provider:  Hilda Blades, MD  Encounter Date: 12/19/2014      PT End of Session - 12/19/14 1208    Visit Number 4   Number of Visits 21   Date for PT Re-Evaluation 02/04/15   Authorization Type gcode 5   Authorization Time Period 10    PT Start Time 1018   PT Stop Time 1102   PT Time Calculation (min) 44 min   Equipment Utilized During Treatment Gait belt   Activity Tolerance Patient tolerated treatment well   Behavior During Therapy [facility] for tasks assessed/performed      Past Medical History  Diagnosis Date  . Stroke 2012   . Hypertension   . Anxiety   . Chronic kidney disease   . Allergy   . Asthma     History reviewed. No pertinent past surgical history.  There were no vitals filed for this visit.  Visit Diagnosis:  Abnormality of gait  Weakness  Difficulty walking      Subjective Assessment - 12/19/14 1024    Subjective Patient states that she is doing well today. No new complaints of pain or muscle soreness upon arrival to PT, but she does report that she is a little tired from doing her exercises this morning   Pertinent History CVA in 2012. Periteneal dialyiss in 2011.  Hemodalysis started in 2012, Patient states that she also had a an infection in R LE, almost requiring amputation.  Also has history of A-fib, CAD, and seizure disorder.    Limitations Standing;Walking;House hold activities   How long can you stand comfortably? Patient reports that she feels nervous with standing without AD, but can stand about as long as she needs with AD    How long can you walk comfortably? for about one hours, provided that she has AD.    Patient Stated Goals sit<>stand with out assistance, decrease  dependance on ADs   Currently in Pain? No/denies           Treatment:  Nustep: level 2, (unbilled)   Sit to stand x8 with bilateral UE use  Standing marches 2x12.  Standing hip flexion red tband 2x10 each LE  Standing hip extension red tand 2x10  Standing hip abduction red tband 2x10  Seated hip flexion with no weight on the R and 2# on the L 2x10  Seated knee extension with 2# on the R and 5# on the L, 2x10  Mini squats attempted, but too painful on the R knee.    CGA, verbal instruction to increase ROM, decreased compensation from trunk with hip extension and abduction, increase eccentric control. Less fatigue noticed with all standing therex.    Balance exercises Toe tapping on 5 inch step x12 each LE one HHA  Lateral toe tapping on 5 inch step x12 each LE one HHA  In and out of semi tandem stance x12 each LE no HHA Standing on airex pad normal BOS 2x30 seconds with weight shift.  CGA for all balance exercises. Verbal and tactile instruction for proper weight shifting and decreased UE use.   Patient also instructed in gait training without AD for 10 ft x2, CGA and verbal instruction for weight shift, but better step length and weight shift noted.  PT Education - 12/19/14 1207    Education provided Yes   Education Details Strengthening and balance exercises.    Person(s) Educated Patient   Methods Explanation;Demonstration;Verbal cues   Comprehension Verbalized understanding;Returned demonstration;Verbal cues required             PT Long Term Goals - 11/26/14 1701    PT LONG TERM GOAL #1   Title Patient will be independent with HEP in order to increase strength/balance and allow greater independence with iADLs by 02/04/2015   Time 10   Period Weeks   Status New   PT LONG TERM GOAL #2   Title Patient will increase 6 minute walk test to >1000 ft in order to increase access to community with greater  independence by 02/04/2015   Time 10   Period Weeks   Status New   PT LONG TERM GOAL #3   Title Patient will decrease 5xSTS to <15 seconds in order to increase LE power and reduce fall risk by 02/04/2015   Time 10   Period Weeks   Status New   PT LONG TERM GOAL #4   Title Patient will increase LE strength to at least 4+/5 throughout in order to allow increased independence with household chores by 02/04/2015   Time 10   Period Weeks   Status New   PT LONG TERM GOAL #5   Title Patient will improve Gait speed to >0.54m/s to be considered a community ambulator and decrease risk for falls by 02/04/2015   Time 10   Period Weeks   Status New               Plan - 12/19/14 1208    Clinical Impression Statement PT instructed patient in LE strengthening and balance training on this day. Patient required min verbal instruction for proper exercise technique including decreased trunk compensation and proper UE use. Resistance increased with standing therex; patient tolerated well, but reports increase fatigue in hip following treatment. With SLS balance exercises, patient needed 1 HHA due to knee pain and increased safety. Continued skilled PT is recommended to increase LE strength, increase endurance, and improve balance and gait to increase ease of access to the community.   Pt will benefit from skilled therapeutic intervention in order to improve on the following deficits Abnormal gait;Cardiopulmonary status limiting activity;Decreased activity tolerance;Decreased balance;Decreased endurance;Decreased mobility;Decreased safety awareness;Decreased strength;Difficulty walking;Increased edema;Impaired perceived functional ability;Improper body mechanics;Postural dysfunction   Rehab Potential Fair   Clinical Impairments Affecting Rehab Potential Negative: Low level baseline, Comorbitiies including CAD, A-fib, ESRD, history of CVA, low family support. Positive: hopeful to improve.    PT Frequency 2x /  week   PT Duration Other (comment)  10 weeks.    PT Treatment/Interventions ADLs/Self Care Home Management;Cryotherapy;Moist Heat;Gait training;Stair training;Functional mobility training;Therapeutic activities;Therapeutic exercise;Balance training;Neuromuscular re-education;Patient/family education;Wheelchair mobility training;Energy conservation   PT Next Visit Plan Strengthening exercises. balance    PT Home Exercise Plan continue as given,    Consulted and Agree with Plan of Care Patient        Problem List There are no active problems to display for this patient.  Grier Rocher SPT 12/19/2014   5:32 PM  This entire session was performed under direct supervision and direction of a licensed therapist . I have personally read, edited and approve of the note as written.  Hopkins,Margaret, PT, DPT 12/19/2014, 5:32 PM  [place] [facility] SERVICES [address] [place], [place], [address] Phone: [phone]  Fax:  [phone]

## 2014-12-24 ENCOUNTER — Encounter: Payer: Self-pay | Admitting: Physical Therapy

## 2014-12-24 ENCOUNTER — Ambulatory Visit: Payer: Medicare Other | Admitting: Physical Therapy

## 2014-12-24 DIAGNOSIS — R262 Difficulty in walking, not elsewhere classified: Secondary | ICD-10-CM

## 2014-12-24 DIAGNOSIS — R269 Unspecified abnormalities of gait and mobility: Secondary | ICD-10-CM | POA: Diagnosis not present

## 2014-12-24 DIAGNOSIS — R531 Weakness: Secondary | ICD-10-CM

## 2014-12-24 NOTE — Therapy (Signed)
[facility] MAIN [facility] SERVICES [address] [place], [place], [address] Phone: [phone]   Fax:  [phone]  Physical Therapy Treatment  Patient Details  Name: Carrie Henry MRN: [id number] Date of Birth: 1955/12/03 Referring Provider:  Hilda Blades, MD  Encounter Date: 12/24/2014      PT End of Session - 12/24/14 1032    Visit Number 6   Number of Visits 21   Date for PT Re-Evaluation 02/04/15   Authorization Type gcode 6   Authorization Time Period 10    PT Start Time 1019   PT Stop Time 1103   PT Time Calculation (min) 44 min   Equipment Utilized During Treatment Gait belt   Activity Tolerance Patient tolerated treatment well   Behavior During Therapy [facility] for tasks assessed/performed      Past Medical History  Diagnosis Date  . Stroke 2012   . Hypertension   . Anxiety   . Chronic kidney disease   . Allergy   . Asthma     History reviewed. No pertinent past surgical history.  There were no vitals filed for this visit.  Visit Diagnosis:  Abnormality of gait  Weakness  Difficulty walking      Subjective Assessment - 12/24/14 1024    Subjective Patient reports that she is doing good upon arrival to PT. She reports that she has been walking in the home with decreased dependence on her AD. She also states that she has been completing her HEP at least 6 days a week, 2 times a day.   Pertinent History CVA in 2012. Periteneal dialyiss in 2011.  Hemodalysis started in 2012, Patient states that she also had a an infection in R LE, almost requiring amputation.  Also has history of A-fib, CAD, and seizure disorder.    Limitations Standing;Walking;House hold activities   How long can you stand comfortably? Patient reports that she feels nervous with standing without AD, but can stand about as long as she needs with AD    How long can you walk comfortably? for about one hours, provided that she has AD.    Patient Stated Goals  sit<>stand with out assistance, decrease dependance on ADs   Currently in Pain? No/denies         Treatment:  Nustep: level 2, (unbilled)   Sit to stand x8 with bilateral UE use  Standing marches bilaterally 2x12.  Mini lunge 2x10 each LE  Calf raises, bilaterally 2x15  Seated hip flexion with no weight on the R and 2# on the L 2x10  CGA, verbal instruction to increase ROM, decreased compensation from trunk with hip extension and abduction, increase eccentric control. Less fatigue noticed with all standing therex.   Balance exercises Toe tapping on airex pad step 2x10 each LE no HHA  Lateral toe tapping on airex pad step x10 each LE no HHA Standing on airex pad normal BOS 2x30 seconds with weight shift.  Min A for SLS on L LE to reduce anxiety that the knee will buckle. CGA for all other balance exercises. Min Verbal and tactile instruction for proper weight shifting and decreased UE use.     Gait training with Rollator in therapy gym 34ft x 3. CGA from PT, Verbal instruction for increased step length bilaterally and increased cadence. Patient demonstrated very good response to instruction.   Gait speed measured at 0.57 m/s   Patient also instructed in gait training without AD for 10 ft x2,  CGA and verbal instruction for weight shift, but better step length and weight shift noted.                          PT Education - 12/24/14 1027    Education provided Yes   Education Details Strengthening, balance, gait    Person(s) Educated Patient   Methods Explanation;Demonstration;Verbal cues;Tactile cues   Comprehension Verbalized understanding;Returned demonstration;Verbal cues required;Tactile cues required             PT Long Term Goals - 11/26/14 1701    PT LONG TERM GOAL #1   Title Patient will be independent with HEP in order to increase strength/balance and allow greater independence with iADLs by 02/04/2015   Time 10   Period Weeks    Status New   PT LONG TERM GOAL #2   Title Patient will increase 6 minute walk test to >1000 ft in order to increase access to community with greater independence by 02/04/2015   Time 10   Period Weeks   Status New   PT LONG TERM GOAL #3   Title Patient will decrease 5xSTS to <15 seconds in order to increase LE power and reduce fall risk by 02/04/2015   Time 10   Period Weeks   Status New   PT LONG TERM GOAL #4   Title Patient will increase LE strength to at least 4+/5 throughout in order to allow increased independence with household chores by 02/04/2015   Time 10   Period Weeks   Status New   PT LONG TERM GOAL #5   Title Patient will improve Gait speed to >0.64m/s to be considered a community ambulator and decrease risk for falls by 02/04/2015   Time 10   Period Weeks   Status New               Plan - 12/24/14 1402    Clinical Impression Statement Patient was instructed in LE strengthening, balance and gait training. PT provided CGA for all standing exercises, and gait exercises; occasional min A for balance exercise. Verbal instruction provided to increase step length and cadence with gait, very good response; normal gait speed measured at 0.57 m/s. Balance training with SLS tasks was advanced to no UE support, PT provided Min A to reduce anxiety that the L knee would buckle. Continued skilled PT is recommended to increase strength and endurance and improve balance and gait to increase ease of access to the community.   Pt will benefit from skilled therapeutic intervention in order to improve on the following deficits Abnormal gait;Cardiopulmonary status limiting activity;Decreased activity tolerance;Decreased balance;Decreased endurance;Decreased mobility;Decreased safety awareness;Decreased strength;Difficulty walking;Increased edema;Impaired perceived functional ability;Improper body mechanics;Postural dysfunction   Rehab Potential Fair   Clinical Impairments Affecting Rehab  Potential Negative: Low level baseline, Comorbitiies including CAD, A-fib, ESRD, history of CVA, low family support. Positive: hopeful to improve.    PT Frequency 2x / week   PT Duration Other (comment)  10 weeks.    PT Treatment/Interventions ADLs/Self Care Home Management;Cryotherapy;Moist Heat;Gait training;Stair training;Functional mobility training;Therapeutic activities;Therapeutic exercise;Balance training;Neuromuscular re-education;Patient/family education;Wheelchair mobility training;Energy conservation   PT Next Visit Plan assess progress   PT Home Exercise Plan continue as given,    Consulted and Agree with Plan of Care Patient        Problem List There are no active problems to display for this patient.  Grier Rocher SPT 12/25/2014   1:20 PM This entire session was performed  under direct supervision and direction of a licensed therapist . I have personally read, edited and approve of the note as written.  Hopkins,Margaret, PT, DPT 12/25/2014, 1:20 PM  [facility] MAIN [facility] SERVICES [address] [place], [place], [address] Phone: [phone]   Fax:  [phone]

## 2014-12-26 ENCOUNTER — Ambulatory Visit: Payer: Medicare Other | Admitting: Physical Therapy

## 2014-12-26 ENCOUNTER — Encounter: Payer: Self-pay | Admitting: Physical Therapy

## 2014-12-26 DIAGNOSIS — R269 Unspecified abnormalities of gait and mobility: Secondary | ICD-10-CM | POA: Diagnosis not present

## 2014-12-26 DIAGNOSIS — R262 Difficulty in walking, not elsewhere classified: Secondary | ICD-10-CM

## 2014-12-26 DIAGNOSIS — R531 Weakness: Secondary | ICD-10-CM

## 2014-12-26 NOTE — Therapy (Signed)
[facility] MAIN [facility] SERVICES [address] [place], [place], [address] Phone: [phone]   Fax:  [phone]  Physical Therapy Treatment  Patient Details  Name: Carrie Henry MRN: [id number] Date of Birth: [birth year] Referring Provider:  Hilda Blades, MD  Encounter Date: 12/26/2014      PT End of Session - 12/26/14 1212    Visit Number 7   Number of Visits 21   Date for PT Re-Evaluation 02/04/15   Authorization Type gcode 7   Authorization Time Period 10    PT Start Time 1003   PT Stop Time 1101   PT Time Calculation (min) 58 min   Equipment Utilized During Treatment Gait belt   Activity Tolerance Patient tolerated treatment well   Behavior During Therapy [facility] for tasks assessed/performed      Past Medical History  Diagnosis Date  . Stroke 2012   . Hypertension   . Anxiety   . Chronic kidney disease   . Allergy   . Asthma     History reviewed. No pertinent past surgical history.  There were no vitals filed for this visit.  Visit Diagnosis:  Abnormality of gait  Weakness  Difficulty walking      Subjective Assessment - 12/26/14 1008    Subjective Patient states that she is doing well upon arrival to PT. She continues to attempt to walk more within the home without AD. No pain or muscle sorness reported. She states that she will miss her PT appointmnet next Thursday on 01/02/15.    Pertinent History CVA in 2012. Periteneal dialyiss in 2011.  Hemodalysis started in 2012, Patient states that she also had a an infection in R LE, almost requiring amputation.  Also has history of A-fib, CAD, and seizure disorder.    Limitations Standing;Walking;House hold activities   How long can you stand comfortably? Patient reports that she feels nervous with standing without AD, but can stand about as long as she needs with AD    How long can you walk comfortably? for about one hours, provided that she has AD.    Patient Stated Goals  sit<>stand with out assistance, decrease dependance on ADs   Currently in Pain? No/denies       Treatment:  Nustep: level 3, , legs only  (unbilled)   Sit to stand x10 with bilateral UE use  Standing marches with red tband bilaterally 2x12.  Mini lunge x10 each LE  Calf raises, bilaterally 2x15  Standing hip abduction red tband x15 each LE  Standing hip extension red tband x15 each LE Standing hip extension red tband x15 each LE  Supine exercises SLR bilaterally 2x 8  Bridges x10  Supine marches x12 each LE    CGA for standing exercise, verbal instruction to increase ROM, decreased compensation from trunk with hip extension and abduction, increase eccentric control, and increased knee control. Less fatigue noticed with all standing therex.   Balance exercises Toe tapping on 4 inch step step 2x10 each LE no HHA  Lateral toe tapping on airex pad step x10 each LE no HHA Stance on Airex pad 2x30 secons with weight shift  Only CGA for SLS balance on bilateral LE. Verbal encouragement required from PT with L LE balance exercises to reduce fear of knee buckling.  Min Verbal and tactile instruction for proper weight shifting and decreased UE use.    Gait training no AD in therapy gym 60ft x 3 and 57ft x 2. CGA  from PT, Verbal instruction for increased step length, foot clearance bilaterally, and increased cadence. Patient demonstrated moderate response to instruction, but demonstrated improve confidence without AD compared to previous sessions.                            PT Education - 12/26/14 1013    Education provided Yes   Education Details strengthening, balance, gait. HEP advanced - see patient instructions    Person(s) Educated Patient   Methods Explanation;Demonstration;Tactile cues;Verbal cues   Comprehension Verbalized understanding;Returned demonstration;Verbal cues required;Tactile cues required             PT Long Term Goals  - 11/26/14 1701    PT LONG TERM GOAL #1   Title Patient will be independent with HEP in order to increase strength/balance and allow greater independence with iADLs by 02/04/2015   Time 10   Period Weeks   Status New   PT LONG TERM GOAL #2   Title Patient will increase 6 minute walk test to >1000 ft in order to increase access to community with greater independence by 02/04/2015   Time 10   Period Weeks   Status New   PT LONG TERM GOAL #3   Title Patient will decrease 5xSTS to <15 seconds in order to increase LE power and reduce fall risk by 02/04/2015   Time 10   Period Weeks   Status New   PT LONG TERM GOAL #4   Title Patient will increase LE strength to at least 4+/5 throughout in order to allow increased independence with household chores by 02/04/2015   Time 10   Period Weeks   Status New   PT LONG TERM GOAL #5   Title Patient will improve Gait speed to >0.33m/s to be considered a community ambulator and decrease risk for falls by 02/04/2015   Time 10   Period Weeks   Status New               Plan - 12/26/14 1212    Clinical Impression Statement Patient was instructed in LE strengthening, balance training, and gait training. PT demonstrates improved ability to ambulate within the therapy gym without AD, she also is able to perform balance training and SLS tasks with greater confidence and less support from PT. Verbal and tactile instruction provided for increased weight shift, increased ROM, proper speed of exercises, and increased quadriceps control with exercises and balance training; patient responded well to instruction. Continued skilled PT is recommended to improve strength, improve gait, and improve balance to allow increased ease of access to the community.      Pt will benefit from skilled therapeutic intervention in order to improve on the following deficits Abnormal gait;Cardiopulmonary status limiting activity;Decreased activity tolerance;Decreased balance;Decreased  endurance;Decreased mobility;Decreased safety awareness;Decreased strength;Difficulty walking;Increased edema;Impaired perceived functional ability;Improper body mechanics;Postural dysfunction   Rehab Potential Fair   Clinical Impairments Affecting Rehab Potential Negative: Low level baseline, Comorbitiies including CAD, A-fib, ESRD, history of CVA, low family support. Positive: hopeful to improve.    PT Frequency 2x / week   PT Duration Other (comment)  10 weeks.    PT Treatment/Interventions ADLs/Self Care Home Management;Cryotherapy;Moist Heat;Gait training;Stair training;Functional mobility training;Therapeutic activities;Therapeutic exercise;Balance training;Neuromuscular re-education;Patient/family education;Wheelchair mobility training;Energy conservation   PT Next Visit Plan CHECK GOALS    PT Home Exercise Plan advanced - see patient instruction    Consulted and Agree with Plan of Care Patient  Problem List There are no active problems to display for this patient.  Grier Rocher SPT 12/26/2014   4:00 PM  This entire session was performed under direct supervision and direction of a licensed therapist . I have personally read, edited and approve of the note as written.  Hopkins,Margaret, PT, DPT 12/26/2014, 4:00 PM  [place] MAIN [facility] SERVICES [address] [place], [place], [address] Phone: [phone]   Fax:  [phone]

## 2014-12-26 NOTE — Patient Instructions (Signed)
Hip Flexion / Knee Extension: Straight-Leg Raise (Eccentric)   Lie on back. Lift leg with knee straight. Slowly lower leg for 3-5 seconds. _8__ reps per set, _2__ sets per day, _at least 5__ days per week. Lower like elevator, stopping at each floor.  Copyright  V[employer]. All rights reserved.

## 2014-12-31 ENCOUNTER — Ambulatory Visit: Payer: Medicare Other | Admitting: Physical Therapy

## 2014-12-31 ENCOUNTER — Encounter: Payer: Self-pay | Admitting: Physical Therapy

## 2014-12-31 DIAGNOSIS — R269 Unspecified abnormalities of gait and mobility: Secondary | ICD-10-CM | POA: Diagnosis not present

## 2014-12-31 DIAGNOSIS — R531 Weakness: Secondary | ICD-10-CM

## 2014-12-31 DIAGNOSIS — R262 Difficulty in walking, not elsewhere classified: Secondary | ICD-10-CM

## 2014-12-31 NOTE — Therapy (Signed)
[place] MAIN [facility] SERVICES [address] [place], [place], [address] Phone: [phone]   Fax:  [phone]  Physical Therapy Treatment/Progress note  11/26/14 - 12/31/14  Patient Details  Name: Carrie Henry MRN: [id number] Date of Birth: 06-06-1955 Referring Provider:  Dewayne Shorter, MD  Encounter Date: 12/31/2014      PT End of Session - 12/31/14 1108    Visit Number 8   Number of Visits 21   Date for PT Re-Evaluation 02/04/15   Authorization Type gcode 1   Authorization Time Period 10    PT Start Time 1018   PT Stop Time 1102   PT Time Calculation (min) 44 min   Equipment Utilized During Treatment Gait belt   Activity Tolerance Patient tolerated treatment well   Behavior During Therapy [facility] for tasks assessed/performed      Past Medical History  Diagnosis Date  . Stroke 2012   . Hypertension   . Anxiety   . Chronic kidney disease   . Allergy   . Asthma     History reviewed. No pertinent past surgical history.  There were no vitals filed for this visit.  Visit Diagnosis:  Abnormality of gait  Weakness  Difficulty walking      Subjective Assessment - 12/31/14 1024    Subjective Patient reports that she is a little tired and a little sore from having a busy day yesterday as well as going to dialysis yesterday morning.    Pertinent History CVA in 2012. Periteneal dialyiss in 2011.  Hemodalysis started in 2012, Patient states that she also had a an infection in R LE, almost requiring amputation.  Also has history of A-fib, CAD, and seizure disorder.    Limitations Standing;Walking;House hold activities   How long can you stand comfortably? Patient reports that she feels nervous with standing without AD, but can stand about as long as she needs with AD    How long can you walk comfortably? for about one hours, provided that she has AD.    Patient Stated Goals sit<>stand with out assistance, decrease dependance on ADs   Currently in Pain? No/denies            [facility] PT Assessment - 12/31/14 1038    Strength   Overall Strength Comments R hip flexion 4-/5 L hip flexion 4/5, R knee extension 4/5, R knee flexion: 4+/5 L knee flexion and extension 4+/5 L DF 4+/5, R 4/5   6 minute walk test results    Aerobic Endurance Distance Walked 610   Endurance additional comments Patient used rollator. Improved from 400 ft on 11/26/14   Standardized Balance Assessment   Five times sit to stand comments  33 seconds with use of bilateral UE ( improved from 39 seconds on 11/26/14)   10 Meter Walk Normal walking speed: 0.75ms with rollator. Fast walking speed:0.779m with rollator.  (Normal walking speed improved from 0.4 on 11/26/14)       Treatment:  Nustep: level 3, 45m80mtes,  (unbilled)   Calf raises, bilaterally 2x15   Standing hip abduction red tband x15 each LE  Standing hip extension red tband x15 each LE Standing hip flexion red tband x15 each LE Sit to stand x8 with bilateral UE use   PT assessed patient goals. Patient instructed in standardized outcome measures including 5xSTS, 10 m Walk test and 6 minute walk test, PT also assessed patient's LE strength via manual muscle testing.  See above for results  PT Education - 01-06-2015 1108    Education provided Yes   Education Details LE strengthening, Continued Plan of care, Standardized outcome measures.    Person(s) Educated Patient   Methods Explanation;Demonstration;Verbal cues   Comprehension Verbalized understanding;Verbal cues required;Returned demonstration             PT Long Term Goals - 2015/01/06 1031    PT LONG TERM GOAL #1   Title Patient will be independent with HEP in order to increase strength/balance and allow greater independence with iADLs by 02/04/2015   Time 10   Period Weeks   Status On-going   PT LONG TERM GOAL #2   Title Patient will increase 6 minute walk test to >1000 ft in order  to increase access to community with greater independence by 02/04/2015   Time 10   Period Weeks   Status Partially Met   PT LONG TERM GOAL #3   Title Patient will decrease 5xSTS to <15 seconds in order to increase LE power and reduce fall risk by 02/04/2015   Time 10   Period Weeks   Status Partially Met   PT LONG TERM GOAL #4   Title Patient will increase LE strength to at least 4+/5 throughout in order to allow increased independence with household chores by 02/04/2015   Time 10   Period Weeks   Status Partially Met   PT LONG TERM GOAL #5   Title Patient will improve Gait speed to >0.62ms to be considered a community ambulator and decrease risk for falls by 02/04/2015   Time 10   Period Weeks   Status Partially Met               Plan - [id number]    Clinical Impression Statement Patient instructed in LE strengthening with min verbal instruction for proper positioning, and increased ROM. PT also assess Patient goals. Increase in strength noted in L hip flexion, R knee flexion and R Ankle DF. Patient demonstrated improvement in function based on an increase of  >2078fwith 6 minute walk test and an increase in normal gait speed. Improvement also noted in balance and LE power with a decrease of 7 seconds on the 5xSTS. Patient continues to require skilled PT in order to increase LE strength and endurance, improve gait, and improve balance to allow increased independence and ease of access to the community.   Pt will benefit from skilled therapeutic intervention in order to improve on the following deficits Abnormal gait;Cardiopulmonary status limiting activity;Decreased activity tolerance;Decreased balance;Decreased endurance;Decreased mobility;Decreased safety awareness;Decreased strength;Difficulty walking;Increased edema;Impaired perceived functional ability;Improper body mechanics;Postural dysfunction   Rehab Potential Fair   Clinical Impairments Affecting Rehab Potential Negative:  Low level baseline, Comorbitiies including CAD, A-fib, ESRD, history of CVA, low family support. Positive: hopeful to improve.    PT Frequency 2x / week   PT Duration Other (comment)  10 weeks.    PT Treatment/Interventions ADLs/Self Care Home Management;Cryotherapy;Moist Heat;Gait training;Stair training;Functional mobility training;Therapeutic activities;Therapeutic exercise;Balance training;Neuromuscular re-education;Patient/family education;Wheelchair mobility training;Energy conservation   PT Next Visit Plan Balance, LE strengthening. increase HEP    PT Home Exercise Plan advanced - see patient instruction    Consulted and Agree with Plan of Care Patient          G-Codes - 08Aug 29, [id number]    Functional Assessment Tool Used 6 min walk, 10 meter, TUG, 5 times sit<>stand   Functional Limitation Mobility: Walking and moving around   Mobility: Walking and Moving Around Current Status ([phone]  At least 40 percent but less than 60 percent impaired, limited or restricted   Mobility: Walking and Moving Around Goal Status [phone]) At least 20 percent but less than 40 percent impaired, limited or restricted      Problem List There are no active problems to display for this patient.  Barrie Folk SPT 12/31/2014   5:35 PM  This entire session was performed under direct supervision and direction of a licensed therapist . I have personally read, edited and approve of the note as written.  Hopkins,Margaret, PT, DPT 12/31/2014, 5:35 PM  [place] MAIN [facility] SERVICES [address] [place], [place], [address] Phone: [phone]   Fax:  [phone]

## 2015-01-02 ENCOUNTER — Encounter: Payer: Medicare Other | Admitting: Physical Therapy

## 2015-01-07 ENCOUNTER — Ambulatory Visit: Payer: Medicare Other

## 2015-01-07 DIAGNOSIS — R262 Difficulty in walking, not elsewhere classified: Secondary | ICD-10-CM

## 2015-01-07 DIAGNOSIS — R531 Weakness: Secondary | ICD-10-CM

## 2015-01-07 DIAGNOSIS — R269 Unspecified abnormalities of gait and mobility: Secondary | ICD-10-CM

## 2015-01-07 NOTE — Therapy (Signed)
[place] [facility] MAIN [facility] SERVICES [address] [place], [place], [address] Phone: [phone]   Fax:  [phone]  Physical Therapy Treatment  Patient Details  Name: Carrie Henry MRN: [id number] Date of Birth: [birth year] Referring Provider:  Hilda Blades, MD  Encounter Date: 01/07/2015      PT End of Session - 01/07/15 1213    Visit Number 9   Number of Visits 21   Date for PT Re-Evaluation 02/04/15   Authorization Type gcode 1   Authorization Time Period 10    PT Start Time 0900   PT Stop Time 0945   PT Time Calculation (min) 45 min   Equipment Utilized During Treatment Gait belt   Activity Tolerance Patient tolerated treatment well   Behavior During Therapy [facility] for tasks assessed/performed      Past Medical History  Diagnosis Date  . Stroke 2012   . Hypertension   . Anxiety   . Chronic kidney disease   . Allergy   . Asthma     History reviewed. No pertinent past surgical history.  There were no vitals filed for this visit.  Visit Diagnosis:  Abnormality of gait  Weakness  Difficulty walking      Subjective Assessment - 01/07/15 1208    Subjective pt relates she is doing well overall but feels a little stiff this morning   Pertinent History CVA in 2012. Periteneal dialyiss in 2011.  Hemodalysis started in 2012, Patient states that she also had a an infection in R LE, almost requiring amputation.  Also has history of A-fib, CAD, and seizure disorder.    Limitations Standing;Walking;House hold activities   How long can you stand comfortably? Patient reports that she feels nervous with standing without AD, but can stand about as long as she needs with AD    How long can you walk comfortably? for about one hours, provided that she has AD.    Patient Stated Goals sit<>stand with out assistance, decrease dependance on ADs        There Ex: Nustep: level 3 x 3 min (no charge) Sit to stand x10 with bilateral UE use   Mini lunge x 5,pt demonstrates increased knee valgus, especially in her right LE Calf raises, bilaterally x15  Standing hip abduction red tband x15 each LE  Standing hip extension red tband x15 each LE Standing hip extension red tband x15 each LE Pt required CGA throughout, and verbal and tactile cues for correct exercise technique, for example upright posture during red band exercises  Neuro Re-ed: Toe tapping on 4 inch step step 2x10 each LE no HHA  Stance on Airex pad 3x30 secons with head turns and UE movement Stance on Airex pad 3x30 seconds with eyes closed and no HHA Staggered stance 3x30 sec with no HHA Stepping over object (stick) and back x15 each LE Side stepping over object (stick) and back x15 each LE pt required CGA-min A through-out neuro re-ed exercises for safety and to assist with balance recovery. Patient required min VCs for balance stability, including to increase trunk control for less loss of balance with smaller base of support                         PT Education - 01/07/15 1210    Education provided Yes   Education Details working on reactive stepping to decrease risk of falls   Person(s) Educated Patient   Methods Explanation;Demonstration  Comprehension Verbalized understanding;Returned demonstration             PT Long Term Goals - 12/31/14 1031    PT LONG TERM GOAL #1   Title Patient will be independent with HEP in order to increase strength/balance and allow greater independence with iADLs by 02/04/2015   Time 10   Period Weeks   Status On-going   PT LONG TERM GOAL #2   Title Patient will increase 6 minute walk test to >1000 ft in order to increase access to community with greater independence by 02/04/2015   Time 10   Period Weeks   Status Partially Met   PT LONG TERM GOAL #3   Title Patient will decrease 5xSTS to <15 seconds in order to increase LE power and reduce fall risk by 02/04/2015   Time 10   Period Weeks    Status Partially Met   PT LONG TERM GOAL #4   Title Patient will increase LE strength to at least 4+/5 throughout in order to allow increased independence with household chores by 02/04/2015   Time 10   Period Weeks   Status Partially Met   PT LONG TERM GOAL #5   Title Patient will improve Gait speed to >0.60m/s to be considered a community ambulator and decrease risk for falls by 02/04/2015   Time 10   Period Weeks   Status Partially Met               Plan - 01/07/15 1214    Clinical Impression Statement Patient was instructed in LE strengthening and balance training. PT provided CGA for all standing exercises and occasional min A during balance activities. Continued skilled PT is recommended to increase strength and endurance and improve balance and gait to increase functional mobility.    Pt will benefit from skilled therapeutic intervention in order to improve on the following deficits Abnormal gait;Cardiopulmonary status limiting activity;Decreased activity tolerance;Decreased balance;Decreased endurance;Decreased mobility;Decreased safety awareness;Decreased strength;Difficulty walking;Increased edema;Impaired perceived functional ability;Improper body mechanics;Postural dysfunction   Rehab Potential Fair   Clinical Impairments Affecting Rehab Potential Negative: Low level baseline, Comorbitiies including CAD, A-fib, ESRD, history of CVA, low family support. Positive: hopeful to improve.    PT Frequency 2x / week   PT Duration Other (comment)  10 weeks.    PT Treatment/Interventions ADLs/Self Care Home Management;Cryotherapy;Moist Heat;Gait training;Stair training;Functional mobility training;Therapeutic activities;Therapeutic exercise;Balance training;Neuromuscular re-education;Patient/family education;Wheelchair mobility training;Energy conservation   PT Next Visit Plan Balance, LE strengthening. increase HEP    PT Home Exercise Plan --   Consulted and Agree with Plan of Care  Patient        Problem List There are no active problems to display for this patient.  Renford Dills, SPT This entire session was performed under direct supervision and direction of a licensed therapist. I have personally read, edited and approve of the note as written.  Hopkins,Margaret, PT, DPT 01/07/2015, 2:01 PM  [place] MAIN [facility] SERVICES [address] [place], [place], [address] Phone: [phone]   Fax:  [phone]

## 2015-01-09 ENCOUNTER — Encounter: Payer: Self-pay | Admitting: Physical Therapy

## 2015-01-09 ENCOUNTER — Ambulatory Visit: Payer: Medicare Other | Attending: Internal Medicine | Admitting: Physical Therapy

## 2015-01-09 DIAGNOSIS — R269 Unspecified abnormalities of gait and mobility: Secondary | ICD-10-CM | POA: Insufficient documentation

## 2015-01-09 DIAGNOSIS — R262 Difficulty in walking, not elsewhere classified: Secondary | ICD-10-CM | POA: Diagnosis present

## 2015-01-09 DIAGNOSIS — R531 Weakness: Secondary | ICD-10-CM | POA: Insufficient documentation

## 2015-01-09 NOTE — Therapy (Signed)
[place] MAIN [facility] SERVICES [address] [place], [place], [address] Phone: [phone]   Fax:  [phone]  Physical Therapy Treatment  Patient Details  Name: Carrie Henry MRN: [id number] Date of Birth: 07/21/1955 Referring Provider:  Dewayne Shorter, MD  Encounter Date: 01/09/2015      PT End of Session - 01/09/15 1031    Visit Number 10   Number of Visits 21   Date for PT Re-Evaluation 02/04/15   Authorization Type gcode 3   Authorization Time Period 10    PT Start Time 1024   PT Stop Time 1104   PT Time Calculation (min) 40 min   Equipment Utilized During Treatment Gait belt   Activity Tolerance Patient tolerated treatment well   Behavior During Therapy [facility] for tasks assessed/performed      Past Medical History  Diagnosis Date  . Stroke 2012   . Hypertension   . Anxiety   . Chronic kidney disease   . Allergy   . Asthma     History reviewed. No pertinent past surgical history.  There were no vitals filed for this visit.  Visit Diagnosis:  Abnormality of gait  Weakness  Difficulty walking      Subjective Assessment - 01/09/15 1029    Subjective Patient states that she is doing well upon arrival to PT. She reports that she was able to walk around the waiting room at the dialysis center yesterday after her treatment for the first time.    Pertinent History CVA in 2012. Periteneal dialyiss in 2011.  Hemodalysis started in 2012, Patient states that she also had a an infection in R LE, almost requiring amputation.  Also has history of A-fib, CAD, and seizure disorder.    Limitations Standing;Walking;House hold activities   How long can you stand comfortably? Patient reports that she feels nervous with standing without AD, but can stand about as long as she needs with AD    How long can you walk comfortably? for about one hours, provided that she has AD.    Patient Stated Goals sit<>stand with out assistance, decrease  dependance on ADs   Currently in Pain? No/denies        Nustep: level 3 x 3 min (no charge)   There Ex:  Sit to stand x10 with bilateral UE use   Mini lunge to BOSU x 10 each LE. 1-0 HHA  Leg press on the quantum press 60# 2x 12 Calf raises on the quantum press 60# 2x15  Side stepping with red tband at knees x10 feet each direction CGA provided by PT to increase safety with standing therex. PT provided min verbal and tactile instruction for increased ROM, increased control with eccentric movements and to keep knees straight with calf raises. Cues also provided for decreased UE support.   Neuro Re-ed:  Toe tapping on 6 inch cone 2x10 each LE  no HHA   Stance on Airex pad with cane raise/lower x10 Stepping into and out of tandem stance x12 each LE, no UE support.  Stepping over object (stick) and back x10 each LE (occasional HHA)   Patient required CGA from PT for all balance exercises. Min Verbal and tactile instruction provided for increased weight shift, proper sequencing of movements with stepping over obstacles with proper heal contact, and to decrease UE support. Patient demonstrated increased fear of falling and subsequently utilized increased UE support with balance exercises on this day.  PT Education - 01/09/15 1031    Education Details Balance and LE strengthening.    Person(s) Educated Patient   Methods Explanation;Demonstration;Tactile cues;Verbal cues   Comprehension Verbalized understanding;Returned demonstration;Verbal cues required;Tactile cues required             PT Long Term Goals - 12/31/14 1031    PT LONG TERM GOAL #1   Title Patient will be independent with HEP in order to increase strength/balance and allow greater independence with iADLs by 02/04/2015   Time 10   Period Weeks   Status On-going   PT LONG TERM GOAL #2   Title Patient will increase 6 minute walk test to >1000 ft in order to increase access to  community with greater independence by 02/04/2015   Time 10   Period Weeks   Status Partially Met   PT LONG TERM GOAL #3   Title Patient will decrease 5xSTS to <15 seconds in order to increase LE power and reduce fall risk by 02/04/2015   Time 10   Period Weeks   Status Partially Met   PT LONG TERM GOAL #4   Title Patient will increase LE strength to at least 4+/5 throughout in order to allow increased independence with household chores by 02/04/2015   Time 10   Period Weeks   Status Partially Met   PT LONG TERM GOAL #5   Title Patient will improve Gait speed to >0.83m/s to be considered a community ambulator and decrease risk for falls by 02/04/2015   Time 10   Period Weeks   Status Partially Met               Plan - 01/09/15 1242    Clinical Impression Statement Patient instructed in LE strengthening and balance training. PT provided CGA with all balance and standing strengthening exercises to improve safety. PT also provided min verbal and tactile instruction for improved weight shift, increased ROM, proper sequencing of movement with dynamic balance exercises and decreased UE support. Patient demonstrated moderate response to instruction from PT. Continued skilled PT is recommended to improve balance, increase strength, and improve gait to allow increased functional mobility within the community.   Pt will benefit from skilled therapeutic intervention in order to improve on the following deficits Abnormal gait;Cardiopulmonary status limiting activity;Decreased activity tolerance;Decreased balance;Decreased endurance;Decreased mobility;Decreased safety awareness;Decreased strength;Difficulty walking;Increased edema;Impaired perceived functional ability;Improper body mechanics;Postural dysfunction   Rehab Potential Fair   Clinical Impairments Affecting Rehab Potential Negative: Low level baseline, Comorbitiies including CAD, A-fib, ESRD, history of CVA, low family support. Positive:  hopeful to improve.    PT Frequency 2x / week   PT Duration Other (comment)  10 weeks.    PT Treatment/Interventions ADLs/Self Care Home Management;Cryotherapy;Moist Heat;Gait training;Stair training;Functional mobility training;Therapeutic activities;Therapeutic exercise;Balance training;Neuromuscular re-education;Patient/family education;Wheelchair mobility training;Energy conservation   PT Next Visit Plan Balance, LE strengthening. increase HEP    PT Home Exercise Plan continue as previously given    Consulted and Agree with Plan of Care Patient        Problem List There are no active problems to display for this patient.  Barrie Folk SPT 01/09/2015   1:24 PM  This entire session was performed under direct supervision and direction of a licensed therapist . I have personally read, edited and approve of the note as written.  Hopkins,Margaret, PT, DPT 01/09/2015, 1:24 PM  [place] MAIN [facility] SERVICES [address] [place], [place], [address] Phone: [phone]   Fax:  [phone]     

## 2015-01-14 ENCOUNTER — Encounter: Payer: Self-pay | Admitting: Physical Therapy

## 2015-01-14 ENCOUNTER — Ambulatory Visit: Payer: Medicare Other

## 2015-01-14 DIAGNOSIS — R269 Unspecified abnormalities of gait and mobility: Secondary | ICD-10-CM

## 2015-01-14 DIAGNOSIS — R262 Difficulty in walking, not elsewhere classified: Secondary | ICD-10-CM

## 2015-01-14 DIAGNOSIS — R531 Weakness: Secondary | ICD-10-CM

## 2015-01-14 NOTE — Therapy (Signed)
[place] MAIN [facility] SERVICES [address] [place], [place], [address] Phone: [phone]   Fax:  [phone]  Physical Therapy Treatment  Patient Details  Name: Carrie Henry MRN: [id number] Date of Birth: January 20, 1956 Referring Provider:  Dewayne Shorter, MD  Encounter Date: 01/14/2015      PT End of Session - 01/14/15 1026    Visit Number 11   Number of Visits 21   Date for PT Re-Evaluation 02/04/15   Authorization Type gcode 4   Authorization Time Period 10    PT Start Time 1017   PT Stop Time 1102   PT Time Calculation (min) 45 min   Equipment Utilized During Treatment Gait belt   Activity Tolerance Patient tolerated treatment well   Behavior During Therapy [facility] for tasks assessed/performed      Past Medical History  Diagnosis Date  . Stroke 2012   . Hypertension   . Anxiety   . Chronic kidney disease   . Allergy   . Asthma     History reviewed. No pertinent past surgical history.  There were no vitals filed for this visit.  Visit Diagnosis:  Abnormality of gait  Weakness  Difficulty walking      Subjective Assessment - 01/14/15 1023    Subjective Patient states she was very active over the weekend going to a cookout and the pool. She states that she was able to tread water in the pool for approx. 20 minutes; which is the longest she has treaded water at one time in years. She also states increased muscle sorness and fatigue secondary to activity yesterday. Reports that she also missed dialysis on 9/5.     Pertinent History CVA in 2012. Peritoneal dialyiss in 2011.  Hemodalysis started in 2012, Patient states that she also had a an infection in R LE, almost requiring amputation.  Also has history of A-fib, CAD, and seizure disorder.    Limitations Standing;Walking;House hold activities   How long can you stand comfortably? Patient reports that she feels nervous with standing without AD, but can stand about as long as she  needs with AD    How long can you walk comfortably? for about one hours, provided that she has AD.    Patient Stated Goals sit<>stand with out assistance, decrease dependance on ADs   Currently in Pain? No/denies            Nustep: level 3 x 3 min (no charge)   There Ex:  Sit to stand x8 with bilateral UE use  Leg press on the quantum press 75# 2x 15 Calf raises on the quantum press 75# 2x15    Seated therex with red tband  Hip flexion 2x10 BLE Hip extension 2 x10 BLE Hip abduction 2x10  BLE knee extension 2x10 BLE Knee flexion 2x 10 BLE  CGA provided by PT to increase safety with seated therex. PT provided min verbal and tactile instruction for increased ROM, increased control with eccentric movements and to keep knees straight with calf raises. Patient demonstrated decreased ROM on the R LE compared to the L. Good response noted by patient secondary to instruction   Gait within therapy gym 3x 67ft, no AD.   Verbal instruction provided by PT to increase step length, increase foot clearance, and to improve weight shift. Patient states increased difficulty with gait without AD, but was noted to have improved step length and weight shift secondary to instruction.  PT Education - 01/14/15 1026    Education provided Yes   Education Details gait and LE stregnthening    Person(s) Educated Patient   Methods Explanation;Demonstration;Tactile cues;Verbal cues   Comprehension Verbalized understanding;Returned demonstration;Verbal cues required;Tactile cues required             PT Long Term Goals - 12/31/14 1031    PT LONG TERM GOAL #1   Title Patient will be independent with HEP in order to increase strength/balance and allow greater independence with iADLs by 02/04/2015   Time 10   Period Weeks   Status On-going   PT LONG TERM GOAL #2   Title Patient will increase 6 minute walk test to >1000 ft in order to increase access to community with  greater independence by 02/04/2015   Time 10   Period Weeks   Status Partially Met   PT LONG TERM GOAL #3   Title Patient will decrease 5xSTS to <15 seconds in order to increase LE power and reduce fall risk by 02/04/2015   Time 10   Period Weeks   Status Partially Met   PT LONG TERM GOAL #4   Title Patient will increase LE strength to at least 4+/5 throughout in order to allow increased independence with household chores by 02/04/2015   Time 10   Period Weeks   Status Partially Met   PT LONG TERM GOAL #5   Title Patient will improve Gait speed to >0.11m/s to be considered a community ambulator and decrease risk for falls by 02/04/2015   Time 10   Period Weeks   Status Partially Met               Plan - 01/14/15 1232    Clinical Impression Statement Patient instructed in LE strengthening and gait training. PT provided min verbal instruction to increase ROM and decrease speed of eccentric movements to increased LE strengthening. Patient responded well to verbal instructions. Minimal standing and balance exercises performed on this day due to increased muscle soreness and fatigue from increased activity on 9/5. PT provided min verbal instruction for gait training without AD to increase step length and improve weight shift; patient demonstrated good response to instruction with gait. Continued skilled PT is recommended to improve gait, balance and strength to allow increased mobility within the community.    Pt will benefit from skilled therapeutic intervention in order to improve on the following deficits Abnormal gait;Cardiopulmonary status limiting activity;Decreased activity tolerance;Decreased balance;Decreased endurance;Decreased mobility;Decreased safety awareness;Decreased strength;Difficulty walking;Increased edema;Impaired perceived functional ability;Improper body mechanics;Postural dysfunction   Rehab Potential Fair   Clinical Impairments Affecting Rehab Potential Negative: Low  level baseline, Comorbitiies including CAD, A-fib, ESRD, history of CVA, low family support. Positive: hopeful to improve.    PT Frequency 2x / week   PT Duration Other (comment)  10 weeks.    PT Treatment/Interventions ADLs/Self Care Home Management;Cryotherapy;Moist Heat;Gait training;Stair training;Functional mobility training;Therapeutic activities;Therapeutic exercise;Balance training;Neuromuscular re-education;Patient/family education;Wheelchair mobility training;Energy conservation   PT Next Visit Plan Balance, LE strengthening. increase HEP    PT Home Exercise Plan continue as previously given    Consulted and Agree with Plan of Care Patient        Problem List There are no active problems to display for this patient.  Barrie Folk SPT 01/14/2015   2:36 PM    Phillips Grout PT, DPT   Huprich,Jason 01/14/2015, 2:36 PM This entire session was performed under direct supervision and direction of a licensed therapist/therapist assistant . I  have personally read, edited and approve of the note as written.   [place] MAIN [facility] SERVICES [address] [place], [place], [address] Phone: [phone]   Fax:  [phone]

## 2015-01-16 ENCOUNTER — Encounter: Payer: Self-pay | Admitting: Physical Therapy

## 2015-01-16 ENCOUNTER — Ambulatory Visit: Payer: Medicare Other | Admitting: Physical Therapy

## 2015-01-16 DIAGNOSIS — R262 Difficulty in walking, not elsewhere classified: Secondary | ICD-10-CM

## 2015-01-16 DIAGNOSIS — R269 Unspecified abnormalities of gait and mobility: Secondary | ICD-10-CM

## 2015-01-16 DIAGNOSIS — R531 Weakness: Secondary | ICD-10-CM

## 2015-01-16 NOTE — Patient Instructions (Signed)
FLEXION: Sitting - Resistance Band (Active)   Sit with right leg extended. Against yellow resistance band, bend knee and draw foot backward. Complete __2_ sets of __10_ repetitions. Perform _2__ sessions per day.  http://gtsc.exer.us/231   Copyright  VHI. All rights reserved.  ABDUCTION: Sitting - Resistance Band (Active)   Sit with feet flat. Lift right leg slightly and, against yellow resistance band, draw it out to side. Complete _2__ sets of __10_ repetitions. Perform _2__ sessions per day.  Copyright  VHI. All rights reserved.  ABDUCTION: Standing - Resistance Band (Active)   Stand, feet flat. Against yellow resistance band, lift right leg out to side. Complete __2_ sets of _10 __ repetitions. Perform 2__ sessions per day.  http://gtsc.exer.us/117   Copyright  VHI. All rights reserved.  EXTENSION: Standing - Resistance Band (Active)   Stand, both feet flat. Against yellow resistance band, draw right leg behind body as far as possible. Complete __2_ sets of __10_ repetitions. Perform _2__ sessions per day.  http://gtsc.exer.us/83   Copyright  VHI. All rights reserved.  Balance, Proprioception: Hip Flexion With Tubing   With tubing attached to ankle of uninvolved leg, swing leg forward. Return. Repeat ___10_ times or for __2 sets . Do _2___ sessions per day.  http://cc.exer.us/18   Copyright  VHI. All rights reserved.

## 2015-01-16 NOTE — Therapy (Signed)
[place] MAIN [facility] SERVICES [address] [place], [place], [address] Phone: [phone]   Fax:  [phone]  Physical Therapy Treatment  Patient Details  Name: Carrie Henry MRN: [id number] Date of Birth: 1955-08-25 Referring Provider:  Dewayne Shorter, MD  Encounter Date: 01/16/2015      PT End of Session - 01/16/15 1034    Visit Number 13   Number of Visits 21   Date for PT Re-Evaluation 02/04/15   Authorization Type gcode [id number]   Authorization Time Period 10    PT Start Time 1018   PT Stop Time 1108   PT Time Calculation (min) 50 min   Equipment Utilized During Treatment Gait belt   Activity Tolerance Patient tolerated treatment well   Behavior During Therapy [facility] for tasks assessed/performed      Past Medical History  Diagnosis Date  . Stroke 2012   . Hypertension   . Anxiety   . Chronic kidney disease   . Allergy   . Asthma     History reviewed. No pertinent past surgical history.  There were no vitals filed for this visit.  Visit Diagnosis:  Abnormality of gait  Weakness  Difficulty walking      Subjective Assessment - 01/16/15 1022    Subjective Patient states that she is doing well upon arrival to PT. Also states that she has continued to be compliant with HEP at least 5 days a week.    Pertinent History CVA in 2012. Peritoneal dialyiss in 2011.  Hemodalysis started in 2012, Patient states that she also had a an infection in R LE, almost requiring amputation.  Also has history of A-fib, CAD, and seizure disorder.    Limitations Standing;Walking;House hold activities   How long can you stand comfortably? Patient reports that she feels nervous with standing without AD, but can stand about as long as she needs with AD    How long can you walk comfortably? for about one hours, provided that she has AD.    Patient Stated Goals sit<>stand with out assistance, decrease dependance on ADs   Currently in Pain? No/denies            Treatment:   Nustep level 3, 3 minutes, (unbilled)   HEP re-education:  Seated therex with red tband:  Hip flexion x12 BLE  Knee extension x12 BLE   HEP advanced:  Seated with red tband Hip abduction x12 BLE  Knee flexion x12 BLE  Standing with red tband  Hip abduction x12 BLE  Hip flexion x12 BLE  Hip extension x12 BLE   For standing and sitting therex PT provided Min Verbal and visual instruction for increased ROM, increased eccentric control, decreased compensation from trunk with standing therex and proper exercise set up.   Gait training within therapy gym 94f x2.  CGA provided by PT with MinA provided x1 for near LOB due to L LE foot drag. Min verbal instruction to increased step length, increased foot clearance, increased heel contact with BLE. Moderate response noted following instructions.   Neuromuscular re-ed.  Standing on airex  Eyes open/eyes closed 3x30 seconds each  Head turns R and L 3x45 seconds Head Nods up and Down 3x45 seconds   PT provided CGA and Min Verbal instruction to reduce UE support and increase hip and ankle strategy to prevent lateral and posterior LOB.                    PT  Education - 01/16/15 1023    Education provided Yes   Education Details LE strengthening. balance. gait   Person(s) Educated Patient   Methods Explanation;Demonstration;Verbal cues   Comprehension Verbalized understanding;Returned demonstration;Verbal cues required             PT Long Term Goals - 12/31/14 1031    PT LONG TERM GOAL #1   Title Patient will be independent with HEP in order to increase strength/balance and allow greater independence with iADLs by 02/04/2015   Time 10   Period Weeks   Status On-going   PT LONG TERM GOAL #2   Title Patient will increase 6 minute walk test to >1000 ft in order to increase access to community with greater independence by 02/04/2015   Time 10   Period Weeks   Status Partially Met   PT  LONG TERM GOAL #3   Title Patient will decrease 5xSTS to <15 seconds in order to increase LE power and reduce fall risk by 02/04/2015   Time 10   Period Weeks   Status Partially Met   PT LONG TERM GOAL #4   Title Patient will increase LE strength to at least 4+/5 throughout in order to allow increased independence with household chores by 02/04/2015   Time 10   Period Weeks   Status Partially Met   PT LONG TERM GOAL #5   Title Patient will improve Gait speed to >0.52ms to be considered a community ambulator and decrease risk for falls by 02/04/2015   Time 10   Period Weeks   Status Partially Met               Plan - 01/16/15 1149    Clinical Impression Statement Patient instructed in LE strengthening, balance training, and gait training. PT provided CGA for all balance and gait training. Min verbal instruction provided for balance and strengthening exercises including increased use of hip strategy to prevent posterior and lateral LOB, as well as cues for increased eccentric control and decreased compensation from the trunk. Gait training performed for 2 bouts of 70 ft without an AD. Patient had one near LOB due to foot drag on the L LE; PT required to provide MinA to prevent fall. PT provided Min verbal instruction to increase step length and increase BLE foot clearance. Moderate response noted following instruction. Continued skilled PT is recommended to increase strength, improve balance, and improve gait to increase function with daily tasks.     Pt will benefit from skilled therapeutic intervention in order to improve on the following deficits Abnormal gait;Cardiopulmonary status limiting activity;Decreased activity tolerance;Decreased balance;Decreased endurance;Decreased mobility;Decreased safety awareness;Decreased strength;Difficulty walking;Increased edema;Impaired perceived functional ability;Improper body mechanics;Postural dysfunction   Rehab Potential Fair   Clinical  Impairments Affecting Rehab Potential Negative: Low level baseline, Comorbitiies including CAD, A-fib, ESRD, history of CVA, low family support. Positive: hopeful to improve.    PT Frequency 2x / week   PT Duration Other (comment)  10 weeks.    PT Treatment/Interventions ADLs/Self Care Home Management;Cryotherapy;Moist Heat;Gait training;Stair training;Functional mobility training;Therapeutic activities;Therapeutic exercise;Balance training;Neuromuscular re-education;Patient/family education;Wheelchair mobility training;Energy conservation   PT Next Visit Plan Balance, LE strengthening. and gait   PT Home Exercise Plan Advanced - see patient instructions   Consulted and Agree with Plan of Care Patient        Problem List There are no active problems to display for this patient.  ABarrie FolkSPT 01/16/2015   3:49 PM  This entire session was performed under direct  supervision and direction of a licensed therapist . I have personally read, edited and approve of the note as written.  Hopkins,Margaret PT, DPT 01/16/2015, 3:49 PM  [place] MAIN [facility] SERVICES [address] [place], [place], [address] Phone: [phone]   Fax:  [phone]

## 2015-01-21 ENCOUNTER — Ambulatory Visit: Payer: Medicare Other | Admitting: Physical Therapy

## 2015-01-21 DIAGNOSIS — R531 Weakness: Secondary | ICD-10-CM

## 2015-01-21 DIAGNOSIS — R269 Unspecified abnormalities of gait and mobility: Secondary | ICD-10-CM

## 2015-01-21 DIAGNOSIS — R262 Difficulty in walking, not elsewhere classified: Secondary | ICD-10-CM

## 2015-01-21 NOTE — Therapy (Signed)
[place] MAIN [facility] SERVICES [address] [place], [place], [address] Phone: [phone]   Fax:  [phone]  Physical Therapy Treatment  Patient Details  Name: Carrie Henry MRN: [id number] Date of Birth: Apr 06, 1956 Referring Provider:  Dewayne Shorter, MD  Encounter Date: 01/21/2015      PT End of Session - 01/21/15 1448    Visit Number 14   Number of Visits 21   Date for PT Re-Evaluation 02/04/15   Authorization Type gcode 6   Authorization Time Period 10    PT Start Time 1016   PT Stop Time 1100   PT Time Calculation (min) 44 min   Equipment Utilized During Treatment Gait belt   Activity Tolerance Patient tolerated treatment well   Behavior During Therapy [facility] for tasks assessed/performed      Past Medical History  Diagnosis Date  . Stroke 2012   . Hypertension   . Anxiety   . Chronic kidney disease   . Allergy   . Asthma     No past surgical history on file.  There were no vitals filed for this visit.  Visit Diagnosis:  Abnormality of gait  Weakness  Difficulty walking      Subjective Assessment - 01/21/15 1030    Subjective Patient states that she is doing well upon arrival to PT. No weakness or muscle soreness noted on this day. States that she was able to walk around with an assistive device for up to 50 ft at the dialysis center.    Pertinent History CVA in 2012. Peritoneal dialyiss in 2011.  Hemodalysis started in 2012, Patient states that she also had a an infection in R LE, almost requiring amputation.  Also has history of A-fib, CAD, and seizure disorder.    Limitations Standing;Walking;House hold activities   How long can you stand comfortably? Patient reports that she feels nervous with standing without AD, but can stand about as long as she needs with AD    How long can you walk comfortably? for about one hours, provided that she has AD.    Patient Stated Goals sit<>stand with out assistance, decrease  dependance on ADs   Currently in Pain? No/denies         Nustep level 4, 3 minutes LE only (unbilled)    Balance  Standing on airexpad: normal BOS 2x30 seconds  Narrow BOS 2x30 seconds  A/P pertubations 2x15 each direction  Lateral pertubations 2x15 each directions  Trunk rotations with ball pass R and L x10  Raise/lower cane x10  standing on 1/2 bolster 2x45 second AP control  PT provided CGA for all balance training. PT provided Min verbal instruction for increased weight shift as well as increased use of hip and ankle strategy to reduce posterior LOB. Cues also provided to reduce UE use. Patient responded well to instruction with improved balance noted while standing on airex pad.  Therex:  Standing: Hip abduction with red tband  x15 each LE  Hip flexion with red tband x15 each LE  Hip abduction with red tband x15 each LE  Calf raises 2x20  Sitting:  Hip flexion  with red tband x15 each LE Hip abduction  with red tband x15 each LE Knee flexion  with red tband x15 each LE Knee extension  with red tband x15 eachLE    Min verbal instruction to increase control with eccentric movement and increase ROM to improve strengthening aspect of exercise. Patient responded very well to  instruction.                 PT Education - 01/21/15 1445    Education provided Yes   Education Details LE strengthening, balance training.    Person(s) Educated Patient   Methods Explanation;Demonstration;Verbal cues   Comprehension Verbalized understanding;Returned demonstration;Verbal cues required             PT Long Term Goals - 12/31/14 1031    PT LONG TERM GOAL #1   Title Patient will be independent with HEP in order to increase strength/balance and allow greater independence with iADLs by 02/04/2015   Time 10   Period Weeks   Status On-going   PT LONG TERM GOAL #2   Title Patient will increase 6 minute walk test to >1000 ft in order to increase access to community with  greater independence by 02/04/2015   Time 10   Period Weeks   Status Partially Met   PT LONG TERM GOAL #3   Title Patient will decrease 5xSTS to <15 seconds in order to increase LE power and reduce fall risk by 02/04/2015   Time 10   Period Weeks   Status Partially Met   PT LONG TERM GOAL #4   Title Patient will increase LE strength to at least 4+/5 throughout in order to allow increased independence with household chores by 02/04/2015   Time 10   Period Weeks   Status Partially Met   PT LONG TERM GOAL #5   Title Patient will improve Gait speed to >0.46m/s to be considered a community ambulator and decrease risk for falls by 02/04/2015   Time 10   Period Weeks   Status Partially Met               Plan - 01/21/15 1448    Clinical Impression Statement Patient instructed in LE strengthening and neuromuscular re-education. PT required to provided CGA with all balance training to increase safety. Min verbal instruction provided provided to improve success with balance exercises to improve weight shift and increase both hip and ankle strategy to prevent posterior LOB. Patient responded well to instruction with increased Anterior/posterior control. Patient also demonstrated improved ROM with LE strengthening exercises compared to previous treatment session. Continued skilled PT is recommended to improve strength, improve gait, and increase balance to improve function as home and in the community.    Pt will benefit from skilled therapeutic intervention in order to improve on the following deficits Abnormal gait;Cardiopulmonary status limiting activity;Decreased activity tolerance;Decreased balance;Decreased endurance;Decreased mobility;Decreased safety awareness;Decreased strength;Difficulty walking;Increased edema;Impaired perceived functional ability;Improper body mechanics;Postural dysfunction   Rehab Potential Fair   Clinical Impairments Affecting Rehab Potential Negative: Low level  baseline, Comorbitiies including CAD, A-fib, ESRD, history of CVA, low family support. Positive: hopeful to improve.    PT Frequency 2x / week   PT Duration Other (comment)  10 weeks.    PT Treatment/Interventions ADLs/Self Care Home Management;Cryotherapy;Moist Heat;Gait training;Stair training;Functional mobility training;Therapeutic activities;Therapeutic exercise;Balance training;Neuromuscular re-education;Patient/family education;Wheelchair mobility training;Energy conservation   PT Next Visit Plan Balance, LE strengthening. and gait   PT Home Exercise Plan Advanced - see patient instructions   Consulted and Agree with Plan of Care Patient        Problem List There are no active problems to display for this patient.   Barrie Folk SPT 01/22/2015   10:40 AM  This entire session was performed under direct supervision and direction of a licensed therapist . I have personally read, edited and approve  of the note as written.    Hopkins,Margaret PT, DPT 01/22/2015, 10:40 AM  [place] MAIN [facility] SERVICES [address] [place], [place], [address] Phone: [phone]   Fax:  [phone]

## 2015-01-23 ENCOUNTER — Encounter: Payer: Self-pay | Admitting: Physical Therapy

## 2015-01-23 ENCOUNTER — Ambulatory Visit: Payer: Medicare Other | Admitting: Physical Therapy

## 2015-01-23 DIAGNOSIS — R269 Unspecified abnormalities of gait and mobility: Secondary | ICD-10-CM | POA: Diagnosis not present

## 2015-01-23 DIAGNOSIS — R262 Difficulty in walking, not elsewhere classified: Secondary | ICD-10-CM

## 2015-01-23 DIAGNOSIS — R531 Weakness: Secondary | ICD-10-CM

## 2015-01-23 NOTE — Therapy (Addendum)
[place] MAIN [facility] SERVICES [address] [place], [place], [address] Phone: [phone]   Fax:  [phone]  Physical Therapy Treatment  Patient Details  Name: Carrie Henry MRN: [id number] Date of Birth: Apr 19, 1956 Referring Provider:  Dewayne Shorter, MD  Encounter Date: 01/23/2015      PT End of Session - 01/23/15 1042    Visit Number 15   Number of Visits 21   Date for PT Re-Evaluation 02/04/15   Authorization Type gcode 7   Authorization Time Period 10    PT Start Time 1016   PT Stop Time 1100   PT Time Calculation (min) 44 min   Equipment Utilized During Treatment Gait belt   Activity Tolerance Patient tolerated treatment well   Behavior During Therapy [facility] for tasks assessed/performed      Past Medical History  Diagnosis Date  . Stroke 2012   . Hypertension   . Anxiety   . Chronic kidney disease   . Allergy   . Asthma     History reviewed. No pertinent past surgical history.  There were no vitals filed for this visit.  Visit Diagnosis:  Abnormality of gait  Weakness  Difficulty walking      Subjective Assessment - 01/23/15 1038    Subjective Patient reports that she is doing well upon arrival to PT. She states that she has no muscle soreness or knee pain on this day, She states that her Legs are a little swollen today, becuase she had a shortened dialysis treatment yesterday.    Pertinent History CVA in 2012. Peritoneal dialyiss in 2011.  Hemodalysis started in 2012, Patient states that she also had a an infection in R LE, almost requiring amputation.  Also has history of A-fib, CAD, and seizure disorder.    Limitations Standing;Walking;House hold activities   How long can you stand comfortably? Patient reports that she feels nervous with standing without AD, but can stand about as long as she needs with AD    How long can you walk comfortably? for about one hours, provided that she has AD.    Patient Stated  Goals sit<>stand with out assistance, decrease dependance on ADs   Currently in Pain? No/denies          Treatment:    Gait training x9ft CGA, without AD.   Verbal instruction provided to increase step length on the R LE as well as increase heel contact bilaterally. Patient responded well to instruction; no LOB or foot drag noted with gait training.    Therapeutic exercise :   Leg press on quantum press 75# 2x15 Calf press on quantum press 75# 2x15   Cues to decrease speed of eccentric movement and proper R LE positioning to decrease knee valgus, reduce pain and increase strengthening.   HEP re-education and advancement  Seated LE therex with green tband  Hip flexion 2x12 Hip abduction 2x12 Knee flexion 2x10 Knee abduction 2x10   Lunges to airex pad x10 each LE  Attempted Sit to stand from elevated mat table without UE support. Patient unable to achieve at this time due to improper forward weight shift as well as increased R LE knee pain.   Supine SLR 2x10 each LE  R LE sidelying clam shells with green tband x10  Bridges x12   PT provided min verbal instruction with seated, standing and supine therex including increased ROM, improved eccentric control, and Proper LE positioning to increase strengthening. Patient demonstrated good response  to instruction from PT.                       PT Education - 01/23/15 1041    Education provided Yes   Education Details LE strengthening. gait training.    Person(s) Educated Patient   Methods Explanation;Demonstration;Verbal cues   Comprehension Verbalized understanding;Returned demonstration;Verbal cues required             PT Long Term Goals - 12/31/14 1031    PT LONG TERM GOAL #1   Title Patient will be independent with HEP in order to increase strength/balance and allow greater independence with iADLs by 02/04/2015   Time 10   Period Weeks   Status On-going   PT LONG TERM GOAL #2   Title Patient  will increase 6 minute walk test to >1000 ft in order to increase access to community with greater independence by 02/04/2015   Time 10   Period Weeks   Status Partially Met   PT LONG TERM GOAL #3   Title Patient will decrease 5xSTS to <15 seconds in order to increase LE power and reduce fall risk by 02/04/2015   Time 10   Period Weeks   Status Partially Met   PT LONG TERM GOAL #4   Title Patient will increase LE strength to at least 4+/5 throughout in order to allow increased independence with household chores by 02/04/2015   Time 10   Period Weeks   Status Partially Met   PT LONG TERM GOAL #5   Title Patient will improve Gait speed to >0.10m/s to be considered a community ambulator and decrease risk for falls by 02/04/2015   Time 10   Period Weeks   Status Partially Met               Plan - 01/23/15 1310    Clinical Impression Statement Patient instructed in LE strengthening as well as gait training. PT provided CGA with gait training without assistive device as well as min verbal instruction to increase step length on the R LE and increase heal contact on bilateral LE. Patient responded very well to instruction and was able to walk 76ft without any LOB and foot drag. HEP advanced to increase resistance; patient tolerated advancement well. Attempted sit to stand from elevated surface, but patient was unable to perform proper forward weight shift and reported increased R knee pain preventing sit to stand. Continued skilled PT is recommended to improve gait, improve LE strength, and improve balance to increase independence and safety within the home and in the community.   Pt will benefit from skilled therapeutic intervention in order to improve on the following deficits Abnormal gait;Cardiopulmonary status limiting activity;Decreased activity tolerance;Decreased balance;Decreased endurance;Decreased mobility;Decreased safety awareness;Decreased strength;Difficulty walking;Increased  edema;Impaired perceived functional ability;Improper body mechanics;Postural dysfunction   Rehab Potential Fair   Clinical Impairments Affecting Rehab Potential Negative: Low level baseline, Comorbitiies including CAD, A-fib, ESRD, history of CVA, low family support. Positive: hopeful to improve.    PT Frequency 2x / week   PT Duration Other (comment)  10 weeks.    PT Treatment/Interventions ADLs/Self Care Home Management;Cryotherapy;Moist Heat;Gait training;Stair training;Functional mobility training;Therapeutic activities;Therapeutic exercise;Balance training;Neuromuscular re-education;Patient/family education;Wheelchair mobility training;Energy conservation   PT Next Visit Plan LE strengthening, dynamic balance.    PT Home Exercise Plan continue as give with increased resistance.    Consulted and Agree with Plan of Care Patient        Problem List There are no active  problems to display for this patient.   Barrie Folk SPT 01/24/2015   11:51 AM   This entire session was performed under direct supervision and direction of a licensed therapist/therapist assistant . I have personally read, edited and approve of the note as written.   Kerman Passey, PT, DPT    01/24/2015, 11:51 AM  [place] MAIN [facility] SERVICES [address] [place], [place], [address] Phone: [phone]   Fax:  [phone]

## 2015-01-28 ENCOUNTER — Encounter: Payer: Self-pay | Admitting: Physical Therapy

## 2015-01-28 ENCOUNTER — Ambulatory Visit: Payer: Medicare Other | Admitting: Physical Therapy

## 2015-01-28 DIAGNOSIS — R269 Unspecified abnormalities of gait and mobility: Secondary | ICD-10-CM | POA: Diagnosis not present

## 2015-01-28 DIAGNOSIS — R262 Difficulty in walking, not elsewhere classified: Secondary | ICD-10-CM

## 2015-01-28 DIAGNOSIS — R531 Weakness: Secondary | ICD-10-CM

## 2015-01-28 NOTE — Therapy (Signed)
[place] MAIN [facility] SERVICES [address] [place], [place], [address] Phone: [phone]   Fax:  [phone]  Physical Therapy Treatment/progress note  12/31/14 - 01/28/15  Patient Details  Name: Carrie Henry MRN: [id number] Date of Birth: 1956/02/28 Referring Provider:  Dewayne Shorter, MD  Encounter Date: 01/28/2015      PT End of Session - 01/28/15 1246    Visit Number 16   Number of Visits 21   Date for PT Re-Evaluation 03/04/15   Authorization Type gcode 1   Authorization Time Period 10    PT Start Time 1015   PT Stop Time 1115   PT Time Calculation (min) 60 min   Equipment Utilized During Treatment Gait belt   Activity Tolerance Patient tolerated treatment well   Behavior During Therapy [facility] for tasks assessed/performed      Past Medical History  Diagnosis Date  . Stroke 2012   . Hypertension   . Anxiety   . Chronic kidney disease   . Allergy   . Asthma     History reviewed. No pertinent past surgical history.  There were no vitals filed for this visit.  Visit Diagnosis:  Abnormality of gait - Plan: PT plan of care cert/re-cert  Weakness - Plan: PT plan of care cert/re-cert  Difficulty walking - Plan: PT plan of care cert/re-cert      Subjective Assessment - 01/28/15 1023    Subjective Patient states that she is doing well upon arrival to PT. She reports that she that she has no pain and reports that she continues to move around the house with increased ease. Patient will not have another appointment this week due to appointments in [facility] regarding kidney transplant.     Pertinent History CVA in 2012. Peritoneal dialyiss in 2011.  Hemodalysis started in 2012, Patient states that she also had a an infection in R LE, almost requiring amputation.  Also has history of A-fib, CAD, and seizure disorder.    Limitations Standing;Walking;House hold activities   How long can you stand comfortably? Patient reports that  she feels nervous with standing without AD, but can stand about as long as she needs with AD    How long can you walk comfortably? for about one hours, provided that she has AD.    Patient Stated Goals sit<>stand with out assistance, decrease dependance on ADs   Currently in Pain? No/denies            [facility] PT Assessment - 01/28/15 0001    Observation/Other Assessments   Activities of Balance Confidence Scale (ABC Scale)  71% (improved from 53% on 7/19)    Strength   Overall Strength Comments R hip flexion 4-/5 L hip flexion 4/5, R knee extension 4/5, R knee flexion: 4+/5 L knee flexion and extension 5/5 L DF 4+/5, R 4/5   6 Minute walk- Post Test   6 Minute Walk Post Test yes   BP (mmHg) (!) 150/98 mmHg   HR (bpm) 110   02 Sat (%RA) 99 %   Modified Borg Scale for Dyspnea 2- Mild shortness of breath   Perceived Rate of Exertion (Borg) 13- Somewhat hard   6 minute walk test results    Aerobic Endurance Distance Walked 725   Endurance additional comments patient utilized rollator (<1000 feet indicates decreased community ambulation and increased fall risk; improved from 610 ft from last PT assessment on 8/23 )   Standardized Balance Assessment   Five  times sit to stand comments  29 seconds (>15 seconds indicates increased fall risk; improve from 33 seconds at last PT assessment on 12/31/14)    10 Meter Walk With rollator. Normal walking speed 0.720ms . Fast walking speed 0.942m (Normal walking speed improve from 0.52 m/s at last PT assessment on 12/31/14)          Treatment:  nustep level 3, 4 minutes (unbilled)   Standing therex with green tband  BLE Hip flexion x15 BLE Hip abduction x15 BLE Hip extension x15   Calf raises x15 BLE   PT provided min verbal instruction for decreased compensation from the trunk and increased eccentric control. Patient responded very well to instruction.   PT instructed patient in standardized outcome measures to assess progress including 6  minute walk test, 5 x Sit<>stand, ABC-s, 10320mlk test and manual muscle testing. See above for results.                    PT Education - 01/28/15 1030    Education provided Yes   Education Details LE strengthening, standardized outcome measures, continued plan of care   Person(s) Educated Patient   Methods Explanation;Demonstration;Verbal cues   Comprehension Verbalized understanding;Returned demonstration;Verbal cues required             PT Long Term Goals - 01/28/15 1055    PT LONG TERM GOAL #1   Title Patient will be independent with HEP in order to increase strength/balance and allow greater independence with iADLs by 03/04/15   Time 10   Period Weeks   Status On-going   PT LONG TERM GOAL #2   Title Patient will increase 6 minute walk test to >1000 ft in order to increase access to community with greater independence by 03/04/15   Time 10   Period Weeks   Status Partially Met   PT LONG TERM GOAL #3   Title Patient will decrease 5xSTS to <15 seconds in order to increase LE power and reduce fall risk by 03/04/15   Time 10   Period Weeks   Status Partially Met   PT LONG TERM GOAL #4   Title Patient will increase LE strength to at least 4+/5 throughout in order to allow increased independence with household chores by 03/04/15   Time 10   Period Weeks   Status Partially Met   PT LONG TERM GOAL #5   Title Patient will improve Gait speed to >0.20m/72mo be considered a community ambulator and decrease risk for falls by 03/04/15   Time 10   Period Weeks   Status Partially Met               Plan - 01/28/15 1247    Clinical Impression Statement Patient instructed in LE strengthening and standardized outcome measures to assess progress. Only min verbal instruction required with therex to decrease trunk compensation and increase eccentric control to improve strengthening. Patient demonstrates improvement in balance noted on improve 5xSTS and improve score  on the ABC-s. Increased endurance and LE strength noted on the 6 minute walk and manual muscle testing. Improve gait ability was also shown via the 10 m walk test with improve gait speed and increased safety. Although patient demonstrated improvement in standardized outcome measures, deficits are still present with LE strength, gait speed, balance, and endurance. Due to continued deficits found at PT assessment, skilled PT is recommended to allow patient to increase independence and ease of access in the community.  Pt will benefit from skilled therapeutic intervention in order to improve on the following deficits Abnormal gait;Cardiopulmonary status limiting activity;Decreased activity tolerance;Decreased balance;Decreased endurance;Decreased mobility;Decreased safety awareness;Decreased strength;Difficulty walking;Increased edema;Impaired perceived functional ability;Improper body mechanics;Postural dysfunction   Rehab Potential Fair   Clinical Impairments Affecting Rehab Potential Negative: Low level baseline, Comorbitiies including CAD, A-fib, ESRD, history of CVA, low family support. Positive: hopeful to improve.    PT Frequency 2x / week   PT Duration Other (comment)  5 weeks    PT Treatment/Interventions ADLs/Self Care Home Management;Cryotherapy;Moist Heat;Gait training;Stair training;Functional mobility training;Therapeutic activities;Therapeutic exercise;Balance training;Neuromuscular re-education;Patient/family education;Wheelchair mobility training;Energy conservation   PT Next Visit Plan LE strengthening, dynamic balance.    PT Home Exercise Plan continue as given   Consulted and Agree with Plan of Care Patient          G-Codes - Feb 07, 2015 1653    Functional Assessment Tool Used 6 min walk, 10 meter, TUG, 5 times sit<>stand   Functional Limitation Mobility: Walking and moving around   Mobility: Walking and Moving Around Current Status ([id number]) At least 40 percent but less than 60  percent impaired, limited or restricted   Mobility: Walking and Moving Around Goal Status [phone]) At least 20 percent but less than 40 percent impaired, limited or restricted      Problem List There are no active problems to display for this patient.  Barrie Folk SPT 02/07/2015   4:58 PM  This entire session was performed under direct supervision and direction of a licensed therapist. I have personally read, edited and approve of the note as written.  Hopkins,Margaret PT, DPT 2015-02-07, 4:58 PM  [place] [facility] MAIN [facility] SERVICES [address] [place], [place], [address] Phone: [phone]   Fax:  [phone]

## 2015-01-30 ENCOUNTER — Encounter: Payer: Medicare Other | Admitting: Physical Therapy

## 2015-02-04 ENCOUNTER — Encounter: Payer: Self-pay | Admitting: Physical Therapy

## 2015-02-04 ENCOUNTER — Ambulatory Visit: Payer: Medicare Other | Admitting: Physical Therapy

## 2015-02-04 DIAGNOSIS — R531 Weakness: Secondary | ICD-10-CM

## 2015-02-04 DIAGNOSIS — R262 Difficulty in walking, not elsewhere classified: Secondary | ICD-10-CM

## 2015-02-04 DIAGNOSIS — R269 Unspecified abnormalities of gait and mobility: Secondary | ICD-10-CM | POA: Diagnosis not present

## 2015-02-04 NOTE — Therapy (Signed)
[place] MAIN [facility] SERVICES [address] [place], [place], [address] Phone: [phone]   Fax:  [phone]  Physical Therapy Treatment  Patient Details  Name: Carrie Henry MRN: [id number] Date of Birth: 1955/12/18 Referring Provider:  Dewayne Shorter, MD  Encounter Date: 02/04/2015      PT End of Session - 02/04/15 1355    Visit Number 17   Number of Visits 21   Date for PT Re-Evaluation 03/04/15   Authorization Type gcode 2   Authorization Time Period 10    PT Start Time 1003   PT Stop Time 1100   PT Time Calculation (min) 57 min   Equipment Utilized During Treatment Gait belt   Activity Tolerance Patient tolerated treatment well;Patient limited by pain   Behavior During Therapy [facility] for tasks assessed/performed      Past Medical History  Diagnosis Date  . Stroke 2012   . Hypertension   . Anxiety   . Chronic kidney disease   . Allergy   . Asthma     History reviewed. No pertinent past surgical history.  There were no vitals filed for this visit.  Visit Diagnosis:  Abnormality of gait  Weakness  Difficulty walking      Subjective Assessment - 02/04/15 1010    Subjective Patient states that she is doing well upon arrival to PT. She reports that she had a quiet weekend after going to to appointments with a Renal specialist at [facility] last Thursday. She also reports that her R knee is is irritable today due to increased pain from arthritis.    Pertinent History CVA in 2012. Peritoneal dialyiss in 2011.  Hemodalysis started in 2012, Patient states that she also had a an infection in R LE, almost requiring amputation.  Also has history of A-fib, CAD, and seizure disorder.    Limitations Standing;Walking;House hold activities   How long can you stand comfortably? Patient reports that she feels nervous with standing without AD, but can stand about as long as she needs with AD    How long can you walk comfortably? for  about one hours, provided that she has AD.    Patient Stated Goals sit<>stand with out assistance, decrease dependance on ADs   Currently in Pain? No/denies         Treatment:   nustep level 3, LE only, 3 minutes (unbilled)   Neuromuscluar re-ed Toe taps on 5 inch step x12 each LE 0-1 HHA Step ups on 4 inch step x10 each LE 1 HHA Stepping over 2 inch pipe x6 each LE. 1 HHA  PT provided moderate verbal instruction for increased weight shift, improved step length, improved heel strike bilaterally, and  decreased UE support. Patient responded moderately to instruction.   Therapeutic exercise :   Leg press on quantum press 90# 2x12 Calf press on quantum press 90# 2x10  PT provided verbal instruction for proper LE position to decrease increased R LE knee valgus, prevent terminal knee extension, and proper speed of movement to increase strength.   Seated LE therex with green tband  Hip flexion 2x12 Hip abduction 2x12 Knee flexion 2x10 Knee abduction 2x10    Supine SLR x10 with the L LE, x2 with R LE but increased knee pain prevented continued repetitions on the R LE. BLE sidelying clam shells with green tband 2x10  Bridges x12 without UE support.   PT provided min verbal instruction with seated, and supine therex including increased ROM,  improved eccentric control, and Proper LE positioning to increase strengthening. Patient demonstrated good response to instruction from PT, but pain in the knee limited R LE strengthening.                          PT Education - 02/04/15 1354    Education provided Yes   Education Details LE strengthening, balance training.    Person(s) Educated Patient   Methods Explanation;Demonstration;Verbal cues   Comprehension Verbalized understanding;Returned demonstration;Verbal cues required             PT Long Term Goals - 01/28/15 1055    PT LONG TERM GOAL #1   Title Patient will be independent with HEP in order to  increase strength/balance and allow greater independence with iADLs by 03/04/15   Time 10   Period Weeks   Status On-going   PT LONG TERM GOAL #2   Title Patient will increase 6 minute walk test to >1000 ft in order to increase access to community with greater independence by 03/04/15   Time 10   Period Weeks   Status Partially Met   PT LONG TERM GOAL #3   Title Patient will decrease 5xSTS to <15 seconds in order to increase LE power and reduce fall risk by 03/04/15   Time 10   Period Weeks   Status Partially Met   PT LONG TERM GOAL #4   Title Patient will increase LE strength to at least 4+/5 throughout in order to allow increased independence with household chores by 03/04/15   Time 10   Period Weeks   Status Partially Met   PT LONG TERM GOAL #5   Title Patient will improve Gait speed to >0.20ms to be considered a community ambulator and decrease risk for falls by 03/04/15   Time 10   Period Weeks   Status Partially Met               Plan - 02/04/15 1355    Clinical Impression Statement Patient instructed in LE strengthening and balance training. PT provided CGA and min verbal instruction with balance activities for improved weight shift, decreased UE support, and proper speed of movement. Patient required 1 HHA for most balance exercises on this day. Min verbal instruction also provided with therapeutic exercises for proper speed of movement and increased ROM. Patient demonstrated increased pain in the R knee due to arthritis flare up, causing difficulty with several exercises. Continued skilled PT is recommended to increase LE strength, improve gait and balance to increase independence.   Pt will benefit from skilled therapeutic intervention in order to improve on the following deficits Abnormal gait;Cardiopulmonary status limiting activity;Decreased activity tolerance;Decreased balance;Decreased endurance;Decreased mobility;Decreased safety awareness;Decreased  strength;Difficulty walking;Increased edema;Impaired perceived functional ability;Improper body mechanics;Postural dysfunction   Rehab Potential Fair   Clinical Impairments Affecting Rehab Potential Negative: Low level baseline, Comorbitiies including CAD, A-fib, ESRD, history of CVA, low family support. Positive: hopeful to improve.    PT Frequency 2x / week   PT Duration Other (comment)  5 weeks    PT Treatment/Interventions ADLs/Self Care Home Management;Cryotherapy;Moist Heat;Gait training;Stair training;Functional mobility training;Therapeutic activities;Therapeutic exercise;Balance training;Neuromuscular re-education;Patient/family education;Wheelchair mobility training;Energy conservation   PT Next Visit Plan LE strengthening, dynamic balance.    PT Home Exercise Plan continue as given   Consulted and Agree with Plan of Care Patient        Problem List There are no active problems to display for this patient.  Barrie Folk SPT 02/05/2015   12:55 PM  This entire session was performed under direct supervision and direction of a licensed therapist. I have personally read, edited and approve of the note as written.  Hopkins,Margaret PT, DPT 02/05/2015, 12:55 PM  [place] MAIN [facility] SERVICES [address] [place], [place], [address] Phone: [phone]   Fax:  [phone]

## 2015-02-06 ENCOUNTER — Ambulatory Visit: Payer: Medicare Other | Admitting: Physical Therapy

## 2015-02-06 ENCOUNTER — Encounter: Payer: Self-pay | Admitting: Physical Therapy

## 2015-02-06 DIAGNOSIS — R531 Weakness: Secondary | ICD-10-CM

## 2015-02-06 DIAGNOSIS — R262 Difficulty in walking, not elsewhere classified: Secondary | ICD-10-CM

## 2015-02-06 DIAGNOSIS — R269 Unspecified abnormalities of gait and mobility: Secondary | ICD-10-CM | POA: Diagnosis not present

## 2015-02-06 NOTE — Therapy (Signed)
[place] MAIN [facility] SERVICES [address] [place], [place], [address] Phone: [phone]   Fax:  [phone]  Physical Therapy Treatment  Patient Details  Name: Carrie Henry MRN: [id number] Date of Birth: Nov 05, 1955 Referring Provider:  Dewayne Shorter, MD  Encounter Date: 02/06/2015      PT End of Session - 02/06/15 1241    Visit Number 18   Number of Visits 21   Date for PT Re-Evaluation 03/04/15   Authorization Type gcode 3   Authorization Time Period 10    PT Start Time 1018   PT Stop Time 1117   PT Time Calculation (min) 59 min   Equipment Utilized During Treatment Gait belt   Activity Tolerance Patient tolerated treatment well;Patient limited by pain   Behavior During Therapy [facility] for tasks assessed/performed      Past Medical History  Diagnosis Date  . Stroke 2012   . Hypertension   . Anxiety   . Chronic kidney disease   . Allergy   . Asthma     History reviewed. No pertinent past surgical history.  There were no vitals filed for this visit.  Visit Diagnosis:  Abnormality of gait  Weakness  Difficulty walking      Subjective Assessment - 02/06/15 1024    Subjective Patient reports that she is doing well upon arrival to PT. She states that she continues to see improvement in function at home. She states that she still has some increased pain in the R knee due to arthritis flare up from weather changes.    Pertinent History CVA in 2012. Peritoneal dialyiss in 2011.  Hemodalysis started in 2012, Patient states that she also had a an infection in R LE, almost requiring amputation.  Also has history of A-fib, CAD, and seizure disorder.    Limitations Standing;Walking;House hold activities   How long can you stand comfortably? Patient reports that she feels nervous with standing without AD, but can stand about as long as she needs with AD    How long can you walk comfortably? for about one hours, provided that she has  AD.    Patient Stated Goals sit<>stand with out assistance, decrease dependance on ADs   Currently in Pain? Yes   Pain Score 2    Pain Location Knee   Pain Orientation Right   Pain Descriptors / Indicators Aching   Pain Type Chronic pain   Pain Onset More than a month ago   Pain Frequency Intermittent       Treatment:    nustep 3 minutes, level 4  Hoist leg press plate 4, 6H68  Hoist hamstring curl plate 4, 3F29 Hoist knee extension, plate 2, M21, within pain free range Mini Lunge on BOSU 2x12  Sit to stand with use of hands x8  Standing therex  BLE Hip flexion green tband x12 BLE Hip extension green tband x12 BLE Hip abduction green tband x12 BLE Terminal knee extension 2x12   Cues from PT for improve LE positioning to decrease knee pain and reduce knee valgus, reduce compensation from trunk, increased erect posture, as well as reduced speed of movement to increase strengthening. Patient also instructed to reduce anchoring knees on chair to improve strengthening in quadriceps with sit to stand. Patient responded well to instruction, but had difficulty achieving full ROM on knee extension due to R LE knee pain.   Standing on airex  weight shift L and R 2x 45 seconds  Reach outside  BOS up and to L x12  Reach outside BOS down and to R x12  Stepping over 2 canes in // bars without UE support x4 laps.   PT noted decreased trunk activation on the R side limiting weigh shift. PT instructed patient in balance tasks to increase R sided trunk activation to improve weight shift and improve posture. Verbal cues to increase ROM of UE, improved weight shifting, and increase hip and knee flexion with stepping task. Patient was able to complete 4 laps of stepping over small obstacles without UE support on this day.                            PT Education - 02/06/15 1240    Education provided Yes   Education Details LE strengthening, balance training   Person(s)  Educated Patient   Methods Explanation;Demonstration;Verbal cues   Comprehension Verbalized understanding;Returned demonstration;Verbal cues required             PT Long Term Goals - 01/28/15 1055    PT LONG TERM GOAL #1   Title Patient will be independent with HEP in order to increase strength/balance and allow greater independence with iADLs by 03/04/15   Time 10   Period Weeks   Status On-going   PT LONG TERM GOAL #2   Title Patient will increase 6 minute walk test to >1000 ft in order to increase access to community with greater independence by 03/04/15   Time 10   Period Weeks   Status Partially Met   PT LONG TERM GOAL #3   Title Patient will decrease 5xSTS to <15 seconds in order to increase LE power and reduce fall risk by 03/04/15   Time 10   Period Weeks   Status Partially Met   PT LONG TERM GOAL #4   Title Patient will increase LE strength to at least 4+/5 throughout in order to allow increased independence with household chores by 03/04/15   Time 10   Period Weeks   Status Partially Met   PT LONG TERM GOAL #5   Title Patient will improve Gait speed to >0.65m/s to be considered a community ambulator and decrease risk for falls by 03/04/15   Time 10   Period Weeks   Status Partially Met               Plan - 02/06/15 1241    Clinical Impression Statement PT instructed patient in balance training and LE strengthening. PT provided verbal instruction for improved LE strengthening technique including proper speed of exercise and improved positioning to increase strengthening and decrease R knee pain and valgus. Patient responded well to instruction. PT noted decreased R sided trunk activation with weight shifting. Balance training incorporating reaching outside of BOS instructed by PT to increase R sided activation. CGA provided with balance exercises to improve safety and min verbal instruction for increased weight shifting and erect posture. Continued skilled PT  is recommended improve balance, increase strength and improve gait to increase independence with ADLs.   Pt will benefit from skilled therapeutic intervention in order to improve on the following deficits Abnormal gait;Cardiopulmonary status limiting activity;Decreased activity tolerance;Decreased balance;Decreased endurance;Decreased mobility;Decreased safety awareness;Decreased strength;Difficulty walking;Increased edema;Impaired perceived functional ability;Improper body mechanics;Postural dysfunction   Rehab Potential Fair   Clinical Impairments Affecting Rehab Potential Negative: Low level baseline, Comorbitiies including CAD, A-fib, ESRD, history of CVA, low family support. Positive: hopeful to improve.    PT Frequency 2x /  week   PT Duration Other (comment)  5 weeks    PT Treatment/Interventions ADLs/Self Care Home Management;Cryotherapy;Moist Heat;Gait training;Stair training;Functional mobility training;Therapeutic activities;Therapeutic exercise;Balance training;Neuromuscular re-education;Patient/family education;Wheelchair mobility training;Energy conservation   PT Next Visit Plan dynamic balance, increase HEP.    PT Home Exercise Plan continue as given   Consulted and Agree with Plan of Care Patient        Problem List There are no active problems to display for this patient.  Barrie Folk SPT 02/06/2015   1:53 PM  This entire session was performed under direct supervision and direction of a licensed therapist. I have personally read, edited and approve of the note as written.   Hopkins,Margaret PT, DPT 02/06/2015, 1:53 PM  [place] MAIN [facility] SERVICES [address] [place], [place], [address] Phone: [phone]   Fax:  [phone]

## 2015-02-11 ENCOUNTER — Encounter: Payer: Self-pay | Admitting: Physical Therapy

## 2015-02-11 ENCOUNTER — Ambulatory Visit: Payer: Medicare Other | Attending: Internal Medicine | Admitting: Physical Therapy

## 2015-02-11 DIAGNOSIS — R531 Weakness: Secondary | ICD-10-CM | POA: Diagnosis present

## 2015-02-11 DIAGNOSIS — R269 Unspecified abnormalities of gait and mobility: Secondary | ICD-10-CM | POA: Diagnosis not present

## 2015-02-11 DIAGNOSIS — R262 Difficulty in walking, not elsewhere classified: Secondary | ICD-10-CM | POA: Insufficient documentation

## 2015-02-11 NOTE — Therapy (Signed)
[place] MAIN [facility] SERVICES [address] [place], [place], [address] Phone: [phone]   Fax:  [phone]  Physical Therapy Treatment  Patient Details  Name: Carrie Henry MRN: [id number] Date of Birth: Oct 07, 1955 Referring Provider:  Dewayne Shorter, MD  Encounter Date: 02/11/2015      PT End of Session - 02/11/15 1309    Visit Number 19   Number of Visits 21   Date for PT Re-Evaluation 03/04/15   Authorization Type gcode [id number]   Authorization Time Period 10    PT Start Time 1300   PT Stop Time 1345   PT Time Calculation (min) 45 min   Equipment Utilized During Treatment Gait belt   Activity Tolerance Patient tolerated treatment well   Behavior During Therapy [facility] for tasks assessed/performed      Past Medical History  Diagnosis Date  . Stroke ([place]) 2012   . Hypertension   . Anxiety   . Chronic kidney disease   . Allergy   . Asthma     History reviewed. No pertinent past surgical history.  There were no vitals filed for this visit.  Visit Diagnosis:  Abnormality of gait  Weakness  Difficulty walking      Subjective Assessment - 02/11/15 1307    Subjective Patient reports that she is doing well. She states that her right knee is feeling much better now that the weather has improved, and that she continues to perform all her home exercises at least once a day.    Pertinent History CVA in 2012. Peritoneal dialyiss in 2011.  Hemodalysis started in 2012, Patient states that she also had a an infection in R LE, almost requiring amputation.  Also has history of A-fib, CAD, and seizure disorder.    Limitations Standing;Walking;House hold activities   How long can you stand comfortably? Patient reports that she feels nervous with standing without AD, but can stand about as long as she needs with AD    How long can you walk comfortably? for about one hours, provided that she has AD.    Patient Stated Goals sit<>stand with out  assistance, decrease dependance on ADs   Currently in Pain? No/denies   Pain Onset More than a month ago         Nustep level 3, 4 minutes (unbilled)   Bridges 2x12 with 3 second hold  SLR with ER  2x12 BLE  Sidelying Straight leg hip abduction x 2 on the R LE. x 8 on the  L LE   Sidelying clam shells green tband 2x12 BLE   PT provided min verbal instruction for increased ROM, increased eccentric control, increased hold time at end range. Difficulty noted with straight leg hip abduction due to decreased strength at the hip.   Neuromuscular re-ed: Lunge step out and back, over 2 inch pipe x10 each LE with 1-0 HHA   Balance HEP education  Narrow BOS with head nods 2x30 seconds  Narrow BOS with head rotations 2x30 seconds  Semi-tandem stance 2x30 seconds BLE  Backward walking in parallel bars x2 laps   PT provided CGA with lunge step, Supervision assist with all other balance exercises with moderate instruction for proper LE positioning, increased use of ankle strategy to prevent lateral LOB, and proper use of UE Support. Increased encouragement required for no HHA with lunge step. Patient responded very well to instruction  PT Education - 02/11/15 1309    Education provided Yes   Education Details LE strengthening, Balance HEP   Person(s) Educated Patient   Methods Explanation;Demonstration;Verbal cues;Tactile cues   Comprehension Verbalized understanding;Returned demonstration;Verbal cues required             PT Long Term Goals - 01/28/15 1055    PT LONG TERM GOAL #1   Title Patient will be independent with HEP in order to increase strength/balance and allow greater independence with iADLs by 03/04/15   Time 10   Period Weeks   Status On-going   PT LONG TERM GOAL #2   Title Patient will increase 6 minute walk test to >1000 ft in order to increase access to community with greater independence by 03/04/15   Time 10    Period Weeks   Status Partially Met   PT LONG TERM GOAL #3   Title Patient will decrease 5xSTS to <15 seconds in order to increase LE power and reduce fall risk by 03/04/15   Time 10   Period Weeks   Status Partially Met   PT LONG TERM GOAL #4   Title Patient will increase LE strength to at least 4+/5 throughout in order to allow increased independence with household chores by 03/04/15   Time 10   Period Weeks   Status Partially Met   PT LONG TERM GOAL #5   Title Patient will improve Gait speed to >0.42m/s to be considered a community ambulator and decrease risk for falls by 03/04/15   Time 10   Period Weeks   Status Partially Met               Plan - 02/11/15 1351    Clinical Impression Statement Patient instructed in LE strengthening as well as balance training. PT provided min verbal instruction with LE strengthening to increase ROM, increase eccentric control and increased hold time at end range. Patient continues to demonstrate decreased ability to perform side lying hip abduction. PT instructed patient in home exercise to improve balance. Moderate verbal instruction provided for proper use of UE, proper LE positioning and increased use of ankle strategy to correct lateral LOB. Patient responded very well to instruction from PT. Continued skilled PT is recommended to increase LE strength, improve balance and enhance gait to improve function and reduce fall risk.   Pt will benefit from skilled therapeutic intervention in order to improve on the following deficits Abnormal gait;Cardiopulmonary status limiting activity;Decreased activity tolerance;Decreased balance;Decreased endurance;Decreased mobility;Decreased safety awareness;Decreased strength;Difficulty walking;Increased edema;Impaired perceived functional ability;Improper body mechanics;Postural dysfunction   Rehab Potential Fair   Clinical Impairments Affecting Rehab Potential Negative: Low level baseline, Comorbitiies  including CAD, A-fib, ESRD, history of CVA, low family support. Positive: hopeful to improve.    PT Frequency 2x / week   PT Duration Other (comment)  5 weeks    PT Treatment/Interventions ADLs/Self Care Home Management;Cryotherapy;Moist Heat;Gait training;Stair training;Functional mobility training;Therapeutic activities;Therapeutic exercise;Balance training;Neuromuscular re-education;Patient/family education;Wheelchair mobility training;Energy conservation   PT Next Visit Plan balance and increased LE strengthening.    PT Home Exercise Plan continue as given   Consulted and Agree with Plan of Care Patient        Problem List There are no active problems to display for this patient.  Barrie Folk SPT 02/12/2015   10:27 AM  This entire session was performed under direct supervision and direction of a licensed therapist . I have personally read, edited and approve of the note as written.  Hopkins,Margaret PT,  DPT 02/12/2015, 10:27 AM  [place] MAIN [facility] SERVICES [address] [place], [place], [address] Phone: [phone]   Fax:  [phone]

## 2015-02-11 NOTE — Patient Instructions (Addendum)
    Copyright  VHI. All rights reserved.  Backward Walking   Walk backward, toes of each foot coming down first. Take long, even strides. Make sure you have a clear pathway with no obstructions when you do this. Stand beside counter and walk backward  And then walk forward doing opposite directions; repeat 10 laps 2x a day at least 5 days a week.  Copyright  VHI. All rights reserved.  Tandem Walking   Stand beside kitchen sink and place one foot in front of the other, lift your hand and try to hold position for 10 sec. Repeat with other foot in front; Repeat 5 reps with each foot in front 5 days a week.Balance: Unilateral     Feet Together, Head Motion - Eyes Open      With eyes open, feet together, move head slowly: up and down. Repeat __10__ times per session. Do _2___ sessions per day.  Copyright  VHI. All rights reserved.

## 2015-02-13 ENCOUNTER — Encounter: Payer: Self-pay | Admitting: Physical Therapy

## 2015-02-13 ENCOUNTER — Ambulatory Visit: Payer: Medicare Other | Admitting: Physical Therapy

## 2015-02-13 DIAGNOSIS — R269 Unspecified abnormalities of gait and mobility: Secondary | ICD-10-CM | POA: Diagnosis not present

## 2015-02-13 DIAGNOSIS — R531 Weakness: Secondary | ICD-10-CM

## 2015-02-13 DIAGNOSIS — R262 Difficulty in walking, not elsewhere classified: Secondary | ICD-10-CM

## 2015-02-13 NOTE — Therapy (Signed)
[place] MAIN [facility] SERVICES [address] [place], [place], [address] Phone: [phone]   Fax:  [phone]  Physical Therapy Treatment  Patient Details  Name: Carrie Henry MRN: [id number] Date of Birth: [birth year] Referring Provider:  Dewayne Shorter, MD  Encounter Date: 02/13/2015      PT End of Session - 02/13/15 1309    Visit Number 20   Number of Visits 21   Date for PT Re-Evaluation 03/04/15   Authorization Type gcode 5   Authorization Time Period 10    PT Start Time 1300   PT Stop Time 1345   PT Time Calculation (min) 45 min   Equipment Utilized During Treatment Gait belt   Activity Tolerance Patient tolerated treatment well   Behavior During Therapy [facility] for tasks assessed/performed      Past Medical History  Diagnosis Date  . Stroke ([place]) 2012   . Hypertension   . Anxiety   . Chronic kidney disease   . Allergy   . Asthma     History reviewed. No pertinent past surgical history.  There were no vitals filed for this visit.  Visit Diagnosis:  Abnormality of gait  Weakness  Difficulty walking      Subjective Assessment - 02/13/15 1307    Subjective Patient reports that she is doing well. She states that her knees are a little stiff this afternoon with a change in weather coming soon.    Pertinent History CVA in 2012. Peritoneal dialyiss in 2011.  Hemodalysis started in 2012, Patient states that she also had a an infection in R LE, almost requiring amputation.  Also has history of A-fib, CAD, and seizure disorder.    Limitations Standing;Walking;House hold activities   How long can you stand comfortably? Patient reports that she feels nervous with standing without AD, but can stand about as long as she needs with AD    How long can you walk comfortably? for about one hours, provided that she has AD.    Patient Stated Goals sit<>stand with out assistance, decrease dependance on ADs   Currently in Pain? No/denies    Pain Onset More than a month ago      nustep level 3, 3 minutes (unbilled)   Gait training on carpet with rollator. 650 ft.  Gait training on carpet without AD, 169f with 1 seated rest break.    PT provided min verbal instruction gait with rollator to increase heel strike and improve erect posture. Moderate verbal instruction provided with gait training without AD to increase step length, increase step height, increase heel strike. Patient responded well to instruction with Rollator and moderately to instruction without AD.   Seated therex with green tband  Hip flexionx12 BLE Hip abduction x12 BLE  Knee flexion x12 BLE Knee extension x12 BLE    Semi press up from sitting with use of UE 2x 10   Supine  Hip abduction, knee straight, with yellow tband x12 each LE  Bridges x 12  Bridge with one LE extended x10 BLE    PT provided min verbal instruction to increase ROM, proper exercise set up , decrease compensation from the trunk, increased control of eccentric movement. Patient tolerated increased resistance with green tband well and reports only slight increase in knee pain.                             PT Education - 02/13/15 1459  Education provided Yes   Education Details LE strengthening, gait training    Person(s) Educated Patient   Methods Explanation;Demonstration;Verbal cues   Comprehension Verbalized understanding;Returned demonstration;Verbal cues required             PT Long Term Goals - 01/28/15 1055    PT LONG TERM GOAL #1   Title Patient will be independent with HEP in order to increase strength/balance and allow greater independence with iADLs by 03/04/15   Time 10   Period Weeks   Status On-going   PT LONG TERM GOAL #2   Title Patient will increase 6 minute walk test to >1000 ft in order to increase access to community with greater independence by 03/04/15   Time 10   Period Weeks   Status Partially Met   PT LONG TERM GOAL #3    Title Patient will decrease 5xSTS to <15 seconds in order to increase LE power and reduce fall risk by 03/04/15   Time 10   Period Weeks   Status Partially Met   PT LONG TERM GOAL #4   Title Patient will increase LE strength to at least 4+/5 throughout in order to allow increased independence with household chores by 03/04/15   Time 10   Period Weeks   Status Partially Met   PT LONG TERM GOAL #5   Title Patient will improve Gait speed to >0.51m/s to be considered a community ambulator and decrease risk for falls by 03/04/15   Time 10   Period Weeks   Status Partially Met               Plan - 02/13/15 1517    Clinical Impression Statement Patient instructed in LE strengthening and gait training. Resistance increased with therex for knee movement from red to green tband. Patient demonstrated near full range of motion with increased resistance. PT instructed patient isolated hip abduction therex; patient demonstrated weakness, but improved ability to isolate hip abduction with resistance compared to previous PT treatments. Min-moderate verbal instruction with gait training for increased step length and improved heel strike; moderate response noted with gait training without AD. Patient demonstrated improved endurance with gait with rollator with improve cadence and decreased need for break with longer distance. Continued skilled PT is recommended to improve balance , strength and gait to increase ease of access to the community.   Pt will benefit from skilled therapeutic intervention in order to improve on the following deficits Abnormal gait;Cardiopulmonary status limiting activity;Decreased activity tolerance;Decreased balance;Decreased endurance;Decreased mobility;Decreased safety awareness;Decreased strength;Difficulty walking;Increased edema;Impaired perceived functional ability;Improper body mechanics;Postural dysfunction   Rehab Potential Fair   Clinical Impairments Affecting Rehab  Potential Negative: Low level baseline, Comorbitiies including CAD, A-fib, ESRD, history of CVA, low family support. Positive: hopeful to improve.    PT Frequency 2x / week   PT Duration Other (comment)  5 weeks    PT Treatment/Interventions ADLs/Self Care Home Management;Cryotherapy;Moist Heat;Gait training;Stair training;Functional mobility training;Therapeutic activities;Therapeutic exercise;Balance training;Neuromuscular re-education;Patient/family education;Wheelchair mobility training;Energy conservation   PT Next Visit Plan increased dynamic balance. strengthening.    PT Home Exercise Plan continue as given   Consulted and Agree with Plan of Care Patient        Problem List There are no active problems to display for this patient.  Barrie Folk SPT 02/13/2015   4:57 PM  This entire session was performed under direct supervision and direction of a licensed therapist . I have personally read, edited and approve of the note as written.  Hopkins,Margaret PT, DPT 02/13/2015, 4:57 PM  [place] MAIN [facility] SERVICES [address] [place], [place], [address] Phone: [phone]   Fax:  [phone]

## 2015-02-18 ENCOUNTER — Ambulatory Visit: Payer: Medicare Other | Admitting: Physical Therapy

## 2015-02-18 ENCOUNTER — Encounter: Payer: Self-pay | Admitting: Physical Therapy

## 2015-02-18 DIAGNOSIS — R262 Difficulty in walking, not elsewhere classified: Secondary | ICD-10-CM

## 2015-02-18 DIAGNOSIS — R269 Unspecified abnormalities of gait and mobility: Secondary | ICD-10-CM

## 2015-02-18 DIAGNOSIS — R531 Weakness: Secondary | ICD-10-CM

## 2015-02-18 NOTE — Therapy (Signed)
[facility] MAIN [facility] SERVICES [address] [place], [place], [address] Phone: [phone]   Fax:  [phone]  Physical Therapy Treatment  Patient Details  Name: Carrie Henry MRN: [id number] Date of Birth: [birth year] Referring Provider:  Hilda Blades, MD  Encounter Date: 02/18/2015      PT End of Session - 02/18/15 0922    Visit Number 21   Number of Visits 30   Date for PT Re-Evaluation 03/04/15   Authorization Type gcode 6   Authorization Time Period 10    PT Start Time [id number]   PT Stop Time 0930   PT Time Calculation (min) 44 min   Equipment Utilized During Treatment Gait belt   Activity Tolerance Patient tolerated treatment well   Behavior During Therapy [facility] for tasks assessed/performed      Past Medical History  Diagnosis Date  . Stroke ([facility]) 2012   . Hypertension   . Anxiety   . Chronic kidney disease   . Allergy   . Asthma     History reviewed. No pertinent past surgical history.  There were no vitals filed for this visit.  Visit Diagnosis:  Abnormality of gait  Weakness  Difficulty walking      Subjective Assessment - 02/18/15 0917    Subjective Patient reports that she is doing well upon arrival to PT. She reports that she is ambulating within the home with greater confidence and success without AD.    Pertinent History CVA in 2012. Peritoneal dialyiss in 2011.  Hemodalysis started in 2012, Patient states that she also had a an infection in R LE, almost requiring amputation.  Also has history of A-fib, CAD, and seizure disorder.    Limitations Standing;Walking;House hold activities   How long can you stand comfortably? Patient reports that she feels nervous with standing without AD, but can stand about as long as she needs with AD    How long can you walk comfortably? for about one hours, provided that she has AD.    Patient Stated Goals sit<>stand with out assistance, decrease dependance on ADs   Currently  in Pain? No/denies   Pain Onset More than a month ago          nustep level 3, 3 minutes (unbilled)   Therex: Quantum Leg press 90# 2 x 15   Quantum Calf raise 90# 2 x12   Seated therex with green tband  Hip flexion 2x 12  Hip abduction 2x 12  Knee extension 2x 12  Knee flexion 2x12   Side stepping with red tband around knees x2 laps in // bars No HHA, CGA provided by PT   PT provided min verbal instruction for proper LE positioning, decreased BLE knee valgus, decreased speed of eccentric motion, and increased ROM to improve strengthening. Patient responded well to instruction and was able to demonstrate increased eccentric control for all LE therex including side stepping.    Gait training without AD 232ft x 2 and 89ft x 4, CGA provided by PT.  PT provided Moderate verbal instruction for increased step length, increased step height, and improved heel strike. Patient demonstrated moderate response to instruction, but demonstrated difficulty maintaining cadence and step length with increased distance.                         PT Education - 02/18/15 0921    Education provided Yes   Education Details Gait training, LE strengthing.  Person(s) Educated Patient   Methods Explanation;Demonstration;Verbal cues   Comprehension Verbalized understanding;Returned demonstration;Verbal cues required             PT Long Term Goals - 01/28/15 1055    PT LONG TERM GOAL #1   Title Patient will be independent with HEP in order to increase strength/balance and allow greater independence with iADLs by 03/04/15   Time 10   Period Weeks   Status On-going   PT LONG TERM GOAL #2   Title Patient will increase 6 minute walk test to >1000 ft in order to increase access to community with greater independence by 03/04/15   Time 10   Period Weeks   Status Partially Met   PT LONG TERM GOAL #3   Title Patient will decrease 5xSTS to <15 seconds in order to increase LE power  and reduce fall risk by 03/04/15   Time 10   Period Weeks   Status Partially Met   PT LONG TERM GOAL #4   Title Patient will increase LE strength to at least 4+/5 throughout in order to allow increased independence with household chores by 03/04/15   Time 10   Period Weeks   Status Partially Met   PT LONG TERM GOAL #5   Title Patient will improve Gait speed to >0.62m/s to be considered a community ambulator and decrease risk for falls by 03/04/15   Time 10   Period Weeks   Status Partially Met               Plan - 02/18/15 0940    Clinical Impression Statement Patient instructed in LE strengthening and gait training without AD. PT was required to provide min verbal instruction with LE therex for improved LE positioning and proper speed of movement. Patient demonstrated improve eccentric control with all LE therex. CGA provided with gait training as well as cues to improve step length and height. Patient was able to walk >200 ft without AD for the first time, but demonstrated decreased step length and step height as distance increased. Continued skilled PT is recommended to improve LE strength, balance and Gait to increase independence at home and in the community.   Pt will benefit from skilled therapeutic intervention in order to improve on the following deficits Abnormal gait;Cardiopulmonary status limiting activity;Decreased activity tolerance;Decreased balance;Decreased endurance;Decreased mobility;Decreased safety awareness;Decreased strength;Difficulty walking;Increased edema;Impaired perceived functional ability;Improper body mechanics;Postural dysfunction   Rehab Potential Fair   Clinical Impairments Affecting Rehab Potential Negative: Low level baseline, Comorbitiies including CAD, A-fib, ESRD, history of CVA, low family support. Positive: hopeful to improve.    PT Frequency 2x / week   PT Duration Other (comment)  5 weeks    PT Treatment/Interventions ADLs/Self Care Home  Management;Cryotherapy;Moist Heat;Gait training;Stair training;Functional mobility training;Therapeutic activities;Therapeutic exercise;Balance training;Neuromuscular re-education;Patient/family education;Wheelchair mobility training;Energy conservation   PT Next Visit Plan increased dynamic balance. strengthening.    PT Home Exercise Plan continue as given   Consulted and Agree with Plan of Care Patient        Problem List There are no active problems to display for this patient.  Barrie Folk SPT 02/18/2015   2:31 PM  This entire session was performed under direct supervision and direction of a licensed therapist . I have personally read, edited and approve of the note as written.  Hopkins,Margaret PT, DPT 02/18/2015, 2:31 PM  [place] MAIN [facility] SERVICES [address] [place], [place], [address] Phone: [phone]   Fax:  [phone]      

## 2015-02-21 ENCOUNTER — Encounter: Payer: Medicare Other | Admitting: Physical Therapy

## 2015-02-25 ENCOUNTER — Ambulatory Visit: Payer: Medicare Other | Admitting: Physical Therapy

## 2015-02-25 DIAGNOSIS — R269 Unspecified abnormalities of gait and mobility: Secondary | ICD-10-CM

## 2015-02-25 DIAGNOSIS — R262 Difficulty in walking, not elsewhere classified: Secondary | ICD-10-CM

## 2015-02-25 DIAGNOSIS — R531 Weakness: Secondary | ICD-10-CM

## 2015-02-26 NOTE — Therapy (Signed)
[place] MAIN [facility] SERVICES [address] [place], [place], [address] Phone: [phone]   Fax:  [phone]  Physical Therapy Treatment  Patient Details  Name: Carrie Henry MRN: [id number] Date of Birth: [birth year] No Data Recorded  Encounter Date: 02/25/2015      PT End of Session - 02/25/15 1232    Visit Number 22   Number of Visits 30   Date for PT Re-Evaluation 03/04/15   Authorization Type gcode 7   Authorization Time Period 10    PT Start Time 1055   PT Stop Time 1140   PT Time Calculation (min) 45 min   Equipment Utilized During Treatment Gait belt   Activity Tolerance Patient tolerated treatment well   Behavior During Therapy WFL for tasks assessed/performed      Past Medical History  Diagnosis Date  . Stroke ([place]) 2012   . Hypertension   . Anxiety   . Chronic kidney disease   . Allergy   . Asthma     No past surgical history on file.  There were no vitals filed for this visit.  Visit Diagnosis:  Abnormality of gait  Weakness  Difficulty walking      Subjective Assessment - 02/25/15 1113    Subjective Patient reports no new falls at home and compliance with HEP, she states she's been quite consistent with things. She reports feeling more comfortable ambulating independently.    Pertinent History CVA in 2012. Peritoneal dialyiss in 2011.  Hemodalysis started in 2012, Patient states that she also had a an infection in R LE, almost requiring amputation.  Also has history of A-fib, CAD, and seizure disorder.    Limitations Standing;Walking;House hold activities   How long can you stand comfortably? Patient reports that she feels nervous with standing without AD, but can stand about as long as she needs with AD    How long can you walk comfortably? for about one hours, provided that she has AD.    Patient Stated Goals sit<>stand with out assistance, decrease dependance on ADs   Currently in Pain? No/denies   "The usual aches and pains"      Sit to stand (5x) - 35.43 seconds, second set untimed (later in session completed in 28.28 seconds with significant encouragement)   Standing heel raises x 20   Lateral walking in // bars for 2 rounds x 2 sets   Retro walking in // bars x 2 sets   Leg Press x 75# cuing for moving as fast as possible for 3 sets of 5-6 repetitions  Leg press x 105# x 10 repetitions (RPE of 5-6), dropped weight to 90# x 12 repetitions (RPE 3-4) completed 2nd set   Single leg deadlifts with HHA x 1 for 8 repetitions (challenging)   Attempted lateral step up, too difficult at this time. Performed forward step ups and downs x4 steps with HHA and step to.                            PT Education - 02/25/15 1135    Education provided Yes   Education Details Gait training, HEP progression, progress with therapy   Person(s) Educated Patient   Methods Explanation;Demonstration   Comprehension Verbalized understanding;Returned demonstration             PT Long Term Goals - 01/28/15 1055    PT LONG TERM GOAL #1   Title Patient will be  independent with HEP in order to increase strength/balance and allow greater independence with iADLs by 03/04/15   Time 10   Period Weeks   Status On-going   PT LONG TERM GOAL #2   Title Patient will increase 6 minute walk test to >1000 ft in order to increase access to community with greater independence by 03/04/15   Time 10   Period Weeks   Status Partially Met   PT LONG TERM GOAL #3   Title Patient will decrease 5xSTS to <15 seconds in order to increase LE power and reduce fall risk by 03/04/15   Time 10   Period Weeks   Status Partially Met   PT LONG TERM GOAL #4   Title Patient will increase LE strength to at least 4+/5 throughout in order to allow increased independence with household chores by 03/04/15   Time 10   Period Weeks   Status Partially Met   PT LONG TERM GOAL #5   Title Patient will  improve Gait speed to >0.53m/s to be considered a community ambulator and decrease risk for falls by 03/04/15   Time 10   Period Weeks   Status Partially Met               Plan - 02/25/15 1232    Clinical Impression Statement Patient continues to demonstrate progress with 5x sit to stand time and leg press weight tolerated in this session. Patient continues to show no loss of balance, though minimal step length with ambulation with no AD. Patient demonstrates proper activation of posterior musculature with single leg deadlift in this session and is able to complete stairs safely, though slowly.    Pt will benefit from skilled therapeutic intervention in order to improve on the following deficits Abnormal gait;Cardiopulmonary status limiting activity;Decreased activity tolerance;Decreased balance;Decreased endurance;Decreased mobility;Decreased safety awareness;Decreased strength;Difficulty walking;Increased edema;Impaired perceived functional ability;Improper body mechanics;Postural dysfunction   Rehab Potential Fair   Clinical Impairments Affecting Rehab Potential Negative: Low level baseline, Comorbitiies including CAD, A-fib, ESRD, history of CVA, low family support. Positive: hopeful to improve.    PT Frequency 2x / week   PT Duration Other (comment)  5 weeks   PT Treatment/Interventions ADLs/Self Care Home Management;Cryotherapy;Moist Heat;Gait training;Stair training;Functional mobility training;Therapeutic activities;Therapeutic exercise;Balance training;Neuromuscular re-education;Patient/family education;Wheelchair mobility training;Energy conservation   PT Next Visit Plan Continue LE strengthening and progress gait training and HEP as tolerated.    PT Home Exercise Plan continue as given   Consulted and Agree with Plan of Care Patient        Problem List There are no active problems to display for this patient.  Kerman Passey, PT, DPT    02/26/2015, 8:34 AM  [place] MAIN [facility] SERVICES [address] [place], [place], [address] Phone: [phone]   Fax:  [phone]  Name: Carrie Henry MRN: [id number] Date of Birth: Sep 20, 1955

## 2015-02-27 ENCOUNTER — Ambulatory Visit: Payer: Medicare Other | Admitting: Physical Therapy

## 2015-03-04 ENCOUNTER — Encounter: Payer: Self-pay | Admitting: Physical Therapy

## 2015-03-04 ENCOUNTER — Ambulatory Visit: Payer: Medicare Other | Admitting: Physical Therapy

## 2015-03-04 DIAGNOSIS — R269 Unspecified abnormalities of gait and mobility: Secondary | ICD-10-CM

## 2015-03-04 DIAGNOSIS — R262 Difficulty in walking, not elsewhere classified: Secondary | ICD-10-CM

## 2015-03-04 DIAGNOSIS — R531 Weakness: Secondary | ICD-10-CM

## 2015-03-04 NOTE — Therapy (Signed)
[facility] MAIN REHAB SERVICES [address] [place], [place], [address] Phone: [phone]   Fax:  [phone]  Physical Therapy Treatment/ Discharge Summary  11/26/14-03/04/15   Patient Details  Name: Carrie Henry MRN: [id number] Date of Birth: 12/13/1955 Referring Provider: Taran Singh MD  Encounter Date: 03/04/2015      PT End of Session - 03/04/15 1104    Visit Number 23   Number of Visits 30   Date for PT Re-Evaluation 03/04/15   Authorization Type gcode 8   Authorization Time Period 10    PT Start Time 1100   PT Stop Time 1155   PT Time Calculation (min) 55 min   Equipment Utilized During Treatment Gait belt   Activity Tolerance Patient tolerated treatment well   Behavior During Therapy WFL for tasks assessed/performed      Past Medical History  Diagnosis Date  . Stroke ([facility]) 2012   . Hypertension   . Anxiety   . Chronic kidney disease   . Allergy   . Asthma     History reviewed. No pertinent past surgical history.  There were no vitals filed for this visit.  Visit Diagnosis:  Abnormality of gait  Weakness  Difficulty walking      Subjective Assessment - 03/04/15 1102    Subjective Patient reports that she is doing well upon arrival. She reports that she continues to complete her home exercises regularly and reports that she continues to feel stronger and function with greater independence at home.     Pertinent History CVA in 2012. Peritoneal dialyiss in 2011.  Hemodalysis started in 2012, Patient states that she also had a an infection in R LE, almost requiring amputation.  Also has history of A-fib, CAD, and seizure disorder.    Limitations Standing;Walking;House hold activities   How long can you stand comfortably? Patient reports that she feels nervous with standing without AD, but can stand about as long as she needs with AD    How long can you walk comfortably? for about one hours, provided that she has  AD.    Patient Stated Goals sit<>stand with out assistance, decrease dependance on ADs   Currently in Pain? No/denies            [facility] PT Assessment - 03/04/15 1105    Assessment   Referring Provider Taran Singh MD   Observation/Other Assessments   Activities of Balance Confidence Scale (ABC Scale)  71% (the higher the number the less imairement); no significant change from last reassessment on 01/28/15 which was 71%   Strength   Overall Strength Comments R hip flexion 4-/5 L hip flexion 4/5, R knee extension 4/5, R knee flexion: 4+/5. L knee flexion and extension 5/5. L DF 4+/5, R DF 4/5.    6 Minute walk- Post Test   6 Minute Walk Post Test yes   BP (mmHg) 134/86 mmHg   HR (bpm) 99   Modified Borg Scale for Dyspnea 0.5- Very, very slight shortness of breath   Perceived Rate of Exertion (Borg) 13- Somewhat hard   6 minute walk test results    Aerobic Endurance Distance Walked 810   Endurance additional comments patient utilized rollator (<1000 feet indicates decreased community ambulation and increased fall risk; improved from 725ft from last PT assessment on 01/28/15 )   Standardized Balance Assessment   Standardized Balance Assessment Berg Balance Test   Five times sit to stand comments  26 seconds ( improved from 29   seconds on 01/28/15)     10 Meter Walk Patient utilized Rollator 0.100ms (limited community ambulator)   BFurniture conservator/restorer  Sit to Stand Able to stand  independently using hands   Standing Unsupported Able to stand safely 2 minutes   Sitting with Back Unsupported but Feet Supported on Floor or Stool Able to sit safely and securely 2 minutes   Stand to Sit Controls descent by using hands   Transfers Able to transfer safely, definite need of hands   Standing Unsupported with Eyes Closed Able to stand 10 seconds safely   Standing Ubsupported with Feet Together Able to place feet together independently and stand 1 minute safely   From Standing, Reach Forward with  Outstretched Arm Can reach forward >12 cm safely (5")   From Standing Position, Pick up Object from Floor Able to pick up shoe, needs supervision   From Standing Position, Turn to Look Behind Over each Shoulder Looks behind one side only/other side shows less weight shift   Turn 360 Degrees Able to turn 360 degrees safely but slowly   Standing Unsupported, Alternately Place Feet on Step/Stool Able to complete >2 steps/needs minimal assist   Standing Unsupported, One Foot in Front Able to take small step independently and hold 30 seconds   Standing on One Leg Tries to lift leg/unable to hold 3 seconds but remains standing independently   Total Score 40   Berg comment: improved from 33 on 12/10/14         Treatment:   PT instructed patient in standardized outcome measures to assess progress towards goals including 5xSTS, Berg balance scale, 6 minute walk test, 1653malk test, ABC-s and Manual muscle testing. See above for results PT provided Supervision assist for balance and gait tasks. Min verbal instruction provided for improve exercise technique and proper testing procedures. Patient demonstrated improved sit to stand technique following instruction from PT.                     PT Education - 03/04/15 1324    Education provided Yes   Education Details Standardized outcome measures. discharge plan   Person(s) Educated Patient   Methods Explanation;Demonstration;Verbal cues   Comprehension Verbalized understanding;Returned demonstration;Verbal cues required             PT Long Term Goals - 03/04/15 1104    PT LONG TERM GOAL #1   Title Patient will be independent with HEP in order to increase strength/balance and allow greater independence with iADLs by 03/04/15   Time 10   Period Weeks   Status On-going   PT LONG TERM GOAL #2   Title Patient will increase 6 minute walk test to >1000 ft in order to increase access to community with greater independence by 03/04/15    Time 10   Period Weeks   Status Partially Met   PT LONG TERM GOAL #3   Title Patient will decrease 5xSTS to <15 seconds in order to increase LE power and reduce fall risk by 03/04/15   Time 10   Period Weeks   Status Partially Met   PT LONG TERM GOAL #4   Title Patient will increase LE strength to at least 4+/5 throughout in order to allow increased independence with household chores by 03/04/15   Time 10   Period Weeks   Status Partially Met   PT LONG TERM GOAL #5   Title Patient will improve Gait speed to >0.53m34mto be considered a  community ambulator and decrease risk for falls by 03/11/15   Time 10   Period Weeks   Status Achieved               Plan - 03/11/15 1325    Clinical Impression Statement Patient instructed in standardized outcome measures to assess progress. PT provided min verbal instruction for proper standardized outcome procedure and positioning. Patient demonstrated significant increase in the 10 m walk test indicating improved community access. Improvements in balance were also noted through decreased time on the 5x sit to stand and the Berg balance score. Ambulation and endurance was noted to have improve as well in increased distance on the 6 minute walk test. LE strength remained constant from last PT assessment, but may have been affected due to increased knee pain. Patient reports that she has not been this physically fit or active in over 3 years and states that she is amazed by the gains made through physical therapy. Due to improved function to beyond prior level of function at home and in the community as well as increased scores on standardized outcome measure, continued skilled PT is no longer recommended at this time. Patient was agreeable to discontinue PT due to improved function, endurance, and strength.   Pt will benefit from skilled therapeutic intervention in order to improve on the following deficits Abnormal gait;Cardiopulmonary status limiting  activity;Decreased activity tolerance;Decreased balance;Decreased endurance;Decreased mobility;Decreased safety awareness;Decreased strength;Difficulty walking;Increased edema;Impaired perceived functional ability;Improper body mechanics;Postural dysfunction   Rehab Potential Fair   Clinical Impairments Affecting Rehab Potential Negative: Low level baseline, Comorbitiies including CAD, A-fib, ESRD, history of CVA, low family support. Positive: hopeful to improve.    PT Frequency 2x / week   PT Duration Other (comment)  5 weeks   PT Treatment/Interventions ADLs/Self Care Home Management;Cryotherapy;Moist Heat;Gait training;Stair training;Functional mobility training;Therapeutic activities;Therapeutic exercise;Balance training;Neuromuscular re-education;Patient/family education;Wheelchair mobility training;Energy conservation   PT Next Visit Plan Pt D/C from PT   PT Home Exercise Plan continue as given. instruction provided for proper advancement of strengthening exercises.    Consulted and Agree with Plan of Care Patient          G-Codes - 03-11-15 1628    Functional Assessment Tool Used 6 min walk, 10 meter,Berg Balance scale, 5 times sit<>stand   Functional Limitation Mobility: Walking and moving around   Mobility: Walking and Moving Around Goal Status [id number]) At least 20 percent but less than 40 percent impaired, limited or restricted   Mobility: Walking and Moving Around Discharge Status [phone]) At least 20 percent but less than 40 percent impaired, limited or restricted      Problem List There are no active problems to display for this patient.  Barrie Folk SPT Mar 11, 2015   4:29 PM  This entire session was performed under direct supervision and direction of a licensed therapist . I have personally read, edited and approve of the note as written.  Hopkins,Margaret PT, DPT 03/11/15, 4:29 PM  [place] MAIN [facility] SERVICES [address]  [place], [place], [address] Phone: [phone]   Fax:  [phone]  Name: Carrie Henry MRN: [id number] Date of Birth: June 16, 1955

## 2015-03-06 ENCOUNTER — Encounter: Payer: Medicare Other | Admitting: Physical Therapy

## 2015-03-13 ENCOUNTER — Encounter: Payer: Medicare Other | Admitting: Physical Therapy

## 2015-03-18 ENCOUNTER — Encounter: Payer: Medicare Other | Admitting: Physical Therapy

## 2015-03-20 ENCOUNTER — Encounter: Payer: Medicare Other | Admitting: Physical Therapy

## 2015-03-25 ENCOUNTER — Encounter: Payer: Medicare Other | Admitting: Physical Therapy

## 2015-03-27 ENCOUNTER — Encounter: Payer: Medicare Other | Admitting: Physical Therapy

## 2015-04-01 ENCOUNTER — Encounter: Payer: Medicare Other | Admitting: Physical Therapy

## 2015-04-08 ENCOUNTER — Encounter: Payer: Medicare Other | Admitting: Physical Therapy

## 2016-05-28 ENCOUNTER — Other Ambulatory Visit
Admission: RE | Admit: 2016-05-28 | Discharge: 2016-05-28 | Disposition: A | Discharge status: Home / Self Care | Payer: Medicare Other | Source: Ambulatory Visit | Attending: Nephrology | Admitting: Nephrology

## 2016-05-28 DIAGNOSIS — N186 End stage renal disease: Secondary | ICD-10-CM | POA: Insufficient documentation

## 2016-05-28 LAB — POTASSIUM: Potassium: 5.5 mmol/L — ABNORMAL HIGH (ref 3.5–5.1)

## 2016-06-07 ENCOUNTER — Emergency Department
Admission: EM | Admit: 2016-06-07 | Discharge: 2016-06-07 | Disposition: A | Discharge status: Home / Self Care | Payer: Medicare Other | Attending: Emergency Medicine | Admitting: Emergency Medicine

## 2016-06-07 ENCOUNTER — Emergency Department: Payer: Medicare Other

## 2016-06-07 ENCOUNTER — Encounter: Payer: Self-pay | Admitting: Emergency Medicine

## 2016-06-07 DIAGNOSIS — W1839XA Other fall on same level, initial encounter: Secondary | ICD-10-CM | POA: Insufficient documentation

## 2016-06-07 DIAGNOSIS — I129 Hypertensive chronic kidney disease with stage 1 through stage 4 chronic kidney disease, or unspecified chronic kidney disease: Secondary | ICD-10-CM | POA: Diagnosis not present

## 2016-06-07 DIAGNOSIS — J45909 Unspecified asthma, uncomplicated: Secondary | ICD-10-CM | POA: Diagnosis not present

## 2016-06-07 DIAGNOSIS — S79911A Unspecified injury of right hip, initial encounter: Secondary | ICD-10-CM | POA: Diagnosis present

## 2016-06-07 DIAGNOSIS — Y929 Unspecified place or not applicable: Secondary | ICD-10-CM | POA: Diagnosis not present

## 2016-06-07 DIAGNOSIS — Y999 Unspecified external cause status: Secondary | ICD-10-CM | POA: Insufficient documentation

## 2016-06-07 DIAGNOSIS — Z7982 Long term (current) use of aspirin: Secondary | ICD-10-CM | POA: Diagnosis not present

## 2016-06-07 DIAGNOSIS — Y939 Activity, unspecified: Secondary | ICD-10-CM | POA: Diagnosis not present

## 2016-06-07 DIAGNOSIS — Z79899 Other long term (current) drug therapy: Secondary | ICD-10-CM | POA: Diagnosis not present

## 2016-06-07 DIAGNOSIS — S7001XA Contusion of right hip, initial encounter: Secondary | ICD-10-CM | POA: Diagnosis not present

## 2016-06-07 DIAGNOSIS — N189 Chronic kidney disease, unspecified: Secondary | ICD-10-CM | POA: Insufficient documentation

## 2016-06-07 NOTE — ED Notes (Signed)
Patient denies pain and is resting comfortably.  

## 2016-06-07 NOTE — ED Notes (Signed)
Patient stood, with minimal assistance to get out of stretcher.  Just c/o some soreness to right hip.

## 2016-06-07 NOTE — ED Triage Notes (Signed)
Patient was on her way to dialysis and she fell backward onto ground.  C/O right hip pain.    No deformity seen, good movement to right leg seen.  + CMS. NAD

## 2016-06-07 NOTE — ED Notes (Signed)
AAOx3.  Skin warm and dry.  To lobby via wheelchair to wait for taxi.  [facility] called and will be able to fit patient in today.  [place] to [facility].

## 2016-06-07 NOTE — ED Provider Notes (Signed)
[facility] Emergency Department Provider Note   ____________________________________________    I have reviewed the triage vital signs and the nursing notes.   HISTORY  Chief Complaint Fall and Hip Pain     HPI Carrie Henry is a 61 y.o. female who presents with right hip pain after a fall. Patient reports she lost her balance and fell onto her right hip. She is able to move her right leg but it is uncomfortable. She denies other injuries. No head injury. No neck injury. No back pain. No abdominal pain   Past Medical History:  Diagnosis Date  . Allergy   . Anxiety   . Asthma   . Chronic kidney disease   . Hypertension   . Stroke [facility]) 2012     There are no active problems to display for this patient.   History reviewed. No pertinent surgical history.  Prior to Admission medications   Medication Sig Start Date End Date Taking? Authorizing Provider  aspirin 81 MG tablet Take 81 mg by mouth daily.    Historical Provider, MD  calcium carbonate (TUMS EX) 750 MG chewable tablet Chew 1 tablet by mouth daily.    Historical Provider, MD  carvedilol (COREG) 12.5 MG tablet Take 12.5 mg by mouth 2 (two) times daily with a meal.    Historical Provider, MD  levETIRAcetam (KEPPRA) 500 MG tablet Take 500 mg by mouth 2 (two) times daily.    Historical Provider, MD  therapeutic multivitamin-minerals (THERAGRAN-M) tablet Take 1 tablet by mouth daily.    Historical Provider, MD     Allergies Patient has no known allergies.  No family history on file.  Social History Social History  Substance Use Topics  . Smoking status: Never Smoker  . Smokeless tobacco: Never Used  . Alcohol use Not on file    Review of Systems  Constitutional: No fever/chills  ENT: No Neck pain CV: No dizziness  Gastrointestinal: No abdominal pain.  No nausea, no vomiting.    Musculoskeletal: Right hip pain as above Skin: Negative for abrasion Neurological: Negative  for headaches or focal deficits    ____________________________________________   PHYSICAL EXAM:  VITAL SIGNS: ED Triage Vitals [06/07/16 1033]  Enc Vitals Group     BP (!) 140/101     Pulse Rate 87     Resp 16     Temp 97.7 F (36.5 C)     Temp Source Oral     SpO2 100 %     Weight 250 lb (113.4 kg)     Height 5\' 6"  (1.676 m)     Head Circumference      Peak Flow      Pain Score 7     Pain Loc      Pain Edu?      Excl. in GC?     Constitutional: Alert and oriented. No acute distress. Pleasant and interactive Eyes: Conjunctivae are normal.  Head: Atraumatic.  Mouth/Throat: Mucous membranes are moist.   Cardiovascular: Normal rate, regular rhythm.  Respiratory: Normal respiratory effort.  No retractions. Genitourinary: deferred Musculoskeletal: Patient is able to range all extremities well. No pain with axial load of right hip, she is able to abduct and adduct the right leg. Mild tenderness to palpation along the right lateral hip. 2+ distal pulses Neurologic:  Normal speech and language. No gross focal neurologic deficits are appreciated.   Skin:  Skin is warm, dry and intact. No rash noted.   ____________________________________________  LABS (all labs ordered are listed, but only abnormal results are displayed)  Labs Reviewed - No data to display ____________________________________________  EKG   ____________________________________________  RADIOLOGY  X-ray unremarkable ____________________________________________   PROCEDURES  Procedure(s) performed: No    Critical Care performed: No ____________________________________________   INITIAL IMPRESSION / ASSESSMENT AND PLAN / ED COURSE  Pertinent labs & imaging results that were available during my care of the patient were reviewed by me and considered in my medical decision making (see chart for details).  Exam is reassuring, x-ray negative for fracture. Patient was able to stand up and  ambulate with a walker as per usual for her. Recommend supportive care appropriate for discharge with outpatient follow-up   ____________________________________________   FINAL CLINICAL IMPRESSION(S) / ED DIAGNOSES  Final diagnoses:  Contusion of right hip, initial encounter      NEW MEDICATIONS STARTED DURING THIS VISIT:  Discharge Medication List as of 06/07/2016 12:04 PM       Note:  This document was prepared using Dragon voice recognition software and may include unintentional dictation errors.    Jene Every, MD 06/07/16 1434

## 2016-06-07 NOTE — ED Notes (Signed)
TO xray via stretcher.  AAOx3.  Skin warm and dry.  NAD

## 2016-11-07 DEATH — deceased
